# Patient Record
Sex: Female | Born: 1935 | Race: White | Hispanic: No | Smoking: Never smoker
Health system: Southern US, Community
[De-identification: ages and names within clinical notes are randomized; demographics above are authoritative.]

## PROBLEM LIST (undated history)

## (undated) DIAGNOSIS — R41841 Cognitive communication deficit: Secondary | ICD-10-CM

## (undated) DIAGNOSIS — E119 Type 2 diabetes mellitus without complications: Secondary | ICD-10-CM

## (undated) DIAGNOSIS — I639 Cerebral infarction, unspecified: Secondary | ICD-10-CM

## (undated) DIAGNOSIS — R296 Repeated falls: Secondary | ICD-10-CM

## (undated) DIAGNOSIS — R4182 Altered mental status, unspecified: Secondary | ICD-10-CM

## (undated) DIAGNOSIS — I4891 Unspecified atrial fibrillation: Secondary | ICD-10-CM

## (undated) DIAGNOSIS — I10 Essential (primary) hypertension: Secondary | ICD-10-CM

## (undated) DIAGNOSIS — F0390 Unspecified dementia without behavioral disturbance: Secondary | ICD-10-CM

## (undated) HISTORY — PX: OTHER SURGICAL HISTORY: SHX169

---

## 1998-03-11 ENCOUNTER — Emergency Department (HOSPITAL_COMMUNITY): Admission: EM | Admit: 1998-03-11 | Discharge: 1998-03-11 | Payer: Self-pay | Admitting: Emergency Medicine

## 1998-09-24 ENCOUNTER — Emergency Department (HOSPITAL_COMMUNITY): Admission: EM | Admit: 1998-09-24 | Discharge: 1998-09-24 | Payer: Self-pay | Admitting: Emergency Medicine

## 1998-10-22 ENCOUNTER — Encounter: Admission: RE | Admit: 1998-10-22 | Discharge: 1998-10-22 | Payer: Self-pay | Admitting: Family Medicine

## 1999-09-30 ENCOUNTER — Ambulatory Visit (HOSPITAL_COMMUNITY): Admission: RE | Admit: 1999-09-30 | Discharge: 1999-09-30 | Payer: Self-pay

## 1999-11-02 ENCOUNTER — Encounter: Admission: RE | Admit: 1999-11-02 | Discharge: 1999-11-02 | Payer: Self-pay | Admitting: Family Medicine

## 2000-06-22 ENCOUNTER — Encounter: Admission: RE | Admit: 2000-06-22 | Discharge: 2000-06-22 | Payer: Self-pay | Admitting: Sports Medicine

## 2000-07-19 ENCOUNTER — Encounter: Admission: RE | Admit: 2000-07-19 | Discharge: 2000-07-19 | Payer: Self-pay | Admitting: Obstetrics & Gynecology

## 2000-07-20 ENCOUNTER — Encounter: Admission: RE | Admit: 2000-07-20 | Discharge: 2000-07-20 | Payer: Self-pay | Admitting: Family Medicine

## 2000-08-25 ENCOUNTER — Encounter: Admission: RE | Admit: 2000-08-25 | Discharge: 2000-08-25 | Payer: Self-pay | Admitting: Internal Medicine

## 2000-08-31 ENCOUNTER — Encounter: Admission: RE | Admit: 2000-08-31 | Discharge: 2000-11-29 | Payer: Self-pay

## 2000-10-28 ENCOUNTER — Encounter: Admission: RE | Admit: 2000-10-28 | Discharge: 2000-10-28 | Payer: Self-pay

## 2000-11-10 ENCOUNTER — Encounter: Admission: RE | Admit: 2000-11-10 | Discharge: 2000-11-10 | Payer: Self-pay | Admitting: Internal Medicine

## 2000-12-31 ENCOUNTER — Encounter: Payer: Self-pay | Admitting: Emergency Medicine

## 2000-12-31 ENCOUNTER — Emergency Department (HOSPITAL_COMMUNITY): Admission: EM | Admit: 2000-12-31 | Discharge: 2000-12-31 | Payer: Self-pay | Admitting: Emergency Medicine

## 2001-04-05 ENCOUNTER — Encounter: Admission: RE | Admit: 2001-04-05 | Discharge: 2001-04-05 | Payer: Self-pay | Admitting: Internal Medicine

## 2001-04-11 ENCOUNTER — Encounter: Admission: RE | Admit: 2001-04-11 | Discharge: 2001-04-11 | Payer: Self-pay | Admitting: Internal Medicine

## 2001-07-03 ENCOUNTER — Encounter: Admission: RE | Admit: 2001-07-03 | Discharge: 2001-07-03 | Payer: Self-pay | Admitting: Internal Medicine

## 2001-07-26 ENCOUNTER — Encounter: Admission: RE | Admit: 2001-07-26 | Discharge: 2001-07-26 | Payer: Self-pay | Admitting: Family Medicine

## 2001-09-11 ENCOUNTER — Encounter: Admission: RE | Admit: 2001-09-11 | Discharge: 2001-09-11 | Payer: Self-pay | Admitting: Internal Medicine

## 2001-10-02 ENCOUNTER — Encounter: Admission: RE | Admit: 2001-10-02 | Discharge: 2001-10-02 | Payer: Self-pay

## 2001-12-29 ENCOUNTER — Encounter: Admission: RE | Admit: 2001-12-29 | Discharge: 2001-12-29 | Payer: Self-pay | Admitting: Internal Medicine

## 2002-04-06 ENCOUNTER — Encounter: Admission: RE | Admit: 2002-04-06 | Discharge: 2002-04-06 | Payer: Self-pay | Admitting: Internal Medicine

## 2002-06-12 ENCOUNTER — Encounter: Admission: RE | Admit: 2002-06-12 | Discharge: 2002-06-12 | Payer: Self-pay | Admitting: Internal Medicine

## 2002-06-15 ENCOUNTER — Encounter: Admission: RE | Admit: 2002-06-15 | Discharge: 2002-06-15 | Payer: Self-pay | Admitting: Internal Medicine

## 2002-07-17 ENCOUNTER — Encounter: Admission: RE | Admit: 2002-07-17 | Discharge: 2002-07-17 | Payer: Self-pay | Admitting: Internal Medicine

## 2002-07-20 ENCOUNTER — Encounter: Admission: RE | Admit: 2002-07-20 | Discharge: 2002-07-20 | Payer: Self-pay | Admitting: Internal Medicine

## 2002-09-06 ENCOUNTER — Encounter: Admission: RE | Admit: 2002-09-06 | Discharge: 2002-09-06 | Payer: Self-pay | Admitting: Internal Medicine

## 2002-09-18 ENCOUNTER — Encounter: Admission: RE | Admit: 2002-09-18 | Discharge: 2002-09-18 | Payer: Self-pay | Admitting: Internal Medicine

## 2003-01-03 ENCOUNTER — Encounter: Admission: RE | Admit: 2003-01-03 | Discharge: 2003-01-03 | Payer: Self-pay | Admitting: Internal Medicine

## 2003-03-20 ENCOUNTER — Encounter (INDEPENDENT_AMBULATORY_CARE_PROVIDER_SITE_OTHER): Payer: Self-pay | Admitting: *Deleted

## 2003-04-01 ENCOUNTER — Encounter: Admission: RE | Admit: 2003-04-01 | Discharge: 2003-04-01 | Payer: Self-pay | Admitting: Internal Medicine

## 2003-04-03 ENCOUNTER — Emergency Department (HOSPITAL_COMMUNITY): Admission: EM | Admit: 2003-04-03 | Discharge: 2003-04-03 | Payer: Self-pay | Admitting: Emergency Medicine

## 2003-04-03 ENCOUNTER — Encounter: Payer: Self-pay | Admitting: Emergency Medicine

## 2003-05-30 ENCOUNTER — Encounter: Admission: RE | Admit: 2003-05-30 | Discharge: 2003-05-30 | Payer: Self-pay | Admitting: Internal Medicine

## 2003-06-04 ENCOUNTER — Encounter: Admission: RE | Admit: 2003-06-04 | Discharge: 2003-06-04 | Payer: Self-pay | Admitting: Internal Medicine

## 2003-07-01 ENCOUNTER — Encounter: Admission: RE | Admit: 2003-07-01 | Discharge: 2003-07-01 | Payer: Self-pay | Admitting: Internal Medicine

## 2003-08-05 ENCOUNTER — Encounter: Admission: RE | Admit: 2003-08-05 | Discharge: 2003-08-05 | Payer: Self-pay | Admitting: Internal Medicine

## 2003-09-09 ENCOUNTER — Encounter: Admission: RE | Admit: 2003-09-09 | Discharge: 2003-12-08 | Payer: Self-pay | Admitting: Internal Medicine

## 2003-10-23 ENCOUNTER — Encounter: Admission: RE | Admit: 2003-10-23 | Discharge: 2003-10-23 | Payer: Self-pay | Admitting: Internal Medicine

## 2004-01-10 ENCOUNTER — Encounter: Admission: RE | Admit: 2004-01-10 | Discharge: 2004-01-10 | Payer: Self-pay | Admitting: Internal Medicine

## 2004-02-07 ENCOUNTER — Encounter: Admission: RE | Admit: 2004-02-07 | Discharge: 2004-02-07 | Payer: Self-pay | Admitting: Internal Medicine

## 2004-02-14 ENCOUNTER — Encounter: Admission: RE | Admit: 2004-02-14 | Discharge: 2004-02-14 | Payer: Self-pay | Admitting: Internal Medicine

## 2004-02-24 ENCOUNTER — Encounter: Admission: RE | Admit: 2004-02-24 | Discharge: 2004-02-24 | Payer: Self-pay | Admitting: Internal Medicine

## 2004-03-02 ENCOUNTER — Encounter: Admission: RE | Admit: 2004-03-02 | Discharge: 2004-03-02 | Payer: Self-pay | Admitting: Internal Medicine

## 2004-04-02 ENCOUNTER — Encounter: Admission: RE | Admit: 2004-04-02 | Discharge: 2004-07-01 | Payer: Self-pay | Admitting: Internal Medicine

## 2004-04-22 ENCOUNTER — Encounter: Admission: RE | Admit: 2004-04-22 | Discharge: 2004-04-22 | Payer: Self-pay | Admitting: Internal Medicine

## 2004-10-08 ENCOUNTER — Ambulatory Visit: Payer: Self-pay | Admitting: Internal Medicine

## 2004-12-25 ENCOUNTER — Ambulatory Visit: Payer: Self-pay | Admitting: Internal Medicine

## 2005-01-11 ENCOUNTER — Ambulatory Visit: Payer: Self-pay | Admitting: Internal Medicine

## 2005-01-19 ENCOUNTER — Ambulatory Visit: Payer: Self-pay | Admitting: Internal Medicine

## 2005-07-16 ENCOUNTER — Ambulatory Visit: Payer: Self-pay | Admitting: Internal Medicine

## 2005-07-23 ENCOUNTER — Ambulatory Visit: Payer: Self-pay | Admitting: Internal Medicine

## 2005-08-02 ENCOUNTER — Ambulatory Visit: Payer: Self-pay | Admitting: Internal Medicine

## 2005-08-17 ENCOUNTER — Ambulatory Visit: Payer: Self-pay | Admitting: Internal Medicine

## 2005-11-11 ENCOUNTER — Ambulatory Visit: Payer: Self-pay | Admitting: Internal Medicine

## 2005-11-11 ENCOUNTER — Encounter (INDEPENDENT_AMBULATORY_CARE_PROVIDER_SITE_OTHER): Payer: Self-pay | Admitting: *Deleted

## 2005-11-19 ENCOUNTER — Ambulatory Visit: Payer: Self-pay | Admitting: Internal Medicine

## 2006-01-04 ENCOUNTER — Ambulatory Visit: Payer: Self-pay | Admitting: Internal Medicine

## 2006-02-07 ENCOUNTER — Ambulatory Visit: Payer: Self-pay | Admitting: Internal Medicine

## 2006-07-11 DIAGNOSIS — M899 Disorder of bone, unspecified: Secondary | ICD-10-CM | POA: Insufficient documentation

## 2006-07-11 DIAGNOSIS — N952 Postmenopausal atrophic vaginitis: Secondary | ICD-10-CM

## 2006-07-11 DIAGNOSIS — E119 Type 2 diabetes mellitus without complications: Secondary | ICD-10-CM

## 2006-07-11 DIAGNOSIS — I8390 Asymptomatic varicose veins of unspecified lower extremity: Secondary | ICD-10-CM

## 2006-07-11 DIAGNOSIS — E785 Hyperlipidemia, unspecified: Secondary | ICD-10-CM

## 2006-07-11 DIAGNOSIS — M949 Disorder of cartilage, unspecified: Secondary | ICD-10-CM

## 2006-07-11 DIAGNOSIS — Z91199 Patient's noncompliance with other medical treatment and regimen due to unspecified reason: Secondary | ICD-10-CM | POA: Insufficient documentation

## 2006-07-11 DIAGNOSIS — Z9119 Patient's noncompliance with other medical treatment and regimen: Secondary | ICD-10-CM

## 2006-07-11 DIAGNOSIS — I10 Essential (primary) hypertension: Secondary | ICD-10-CM | POA: Insufficient documentation

## 2006-12-19 ENCOUNTER — Telehealth: Payer: Self-pay | Admitting: *Deleted

## 2008-05-02 ENCOUNTER — Emergency Department (HOSPITAL_COMMUNITY): Admission: EM | Admit: 2008-05-02 | Discharge: 2008-05-02 | Payer: Self-pay | Admitting: Emergency Medicine

## 2009-05-31 ENCOUNTER — Emergency Department (HOSPITAL_COMMUNITY): Admission: EM | Admit: 2009-05-31 | Discharge: 2009-05-31 | Payer: Self-pay | Admitting: Emergency Medicine

## 2010-04-26 ENCOUNTER — Emergency Department (HOSPITAL_COMMUNITY): Admission: EM | Admit: 2010-04-26 | Discharge: 2010-04-26 | Payer: Self-pay | Admitting: Emergency Medicine

## 2011-02-02 NOTE — Consult Note (Signed)
NAME:  [name], [name] NO.:  [id number]   MEDICAL RECORD NO.:  [id number]          PATIENT TYPE:  EMS   LOCATION:  [facility]                         FACILITY:  [facility]   PHYSICIAN:  Alvy Beal, MD    DATE OF BIRTH:  09-Dec-1920   DATE OF CONSULTATION:  05/02/2008  DATE OF DISCHARGE:  05/02/2008                                 CONSULTATION   Consultation requested by the emergency room physician, Dr. Bebe Shaggy,  for evaluation of left patella fracture.   HISTORY:  Megan Arnold is a very pleasant, very active young 77 year old woman  who was out at the pool swimming today.  She had lost her balance and  tripped over an object and fell and struck her left knee and left wrist.  She was brought to the emergency room because of pain with attempts at  ambulation.  X-rays here in the emergency room demonstrated a minimally  displaced left patella fracture as well as a triquetral fracture of the  left wrist.  The hand consultation was requested by the on-call surgeon,  and an orthopedic consultation was requested for evaluation of the  patellar fracture.   The patient's past medical, surgical, family, and social history  includes recent onset of diabetes treated medically with metformin.  She  also has hypertension.  She is currently taking hydrochlorothiazide.  She is a nondrinker, nonsmoker, and is otherwise healthy.   She has no known drug allergies.   CLINICAL EXAM:  She is a pleasant woman who appears her stated age in no  acute distress.  She is alert and oriented x3.  She is neurovascularly  intact with 2+ dorsalis pedis and posterior tibialis pulses.  The calf  is soft and nontender.  EHL, gastrocnemius, and tibialis anterior are  all intact 5/5 bilaterally.  She has a small superficial abrasion over  the left knee, but there is no evidence of any violation of the dermis.  Her left upper extremity is in a wrist splint.  She is moving all  fingers.  She is actually  weightbearing with the crutches without  significant wrist pain.  There is no elbow or shoulder pain.  No  shortness of breath or chest pain.  Abdomen is soft and nontender.   X-rays demonstrate a minimally displaced patellar fracture.  There is no  patella alta, patella baja, or significant diastasis.   At this point in time, the patient's clinical exam demonstrates an  effusion and tenderness, but overall the extensor mechanism is grossly  intact.  She is able to straight leg raise.  It is very painful, but she  is able to do that.  There is no palpable gap, but there is tenderness  at the patella.  At this point, given the fact of her gaze, relative  osteoporosis, I do not think surgical fixation (open reduction and  internal fixation) is required.  I am going to see her again in a week  to re-evaluate.  In the meantime, I offered her narcotic pain  medications.  She said she just wants to use Tylenol which I think is  adequate.  I am going to have her followup with me in about a week so I  can repeat the x-rays.  We will keep her in a knee immobilizer at all  times to prevent any flexion.  At this point, because of the wrist  fracture, I am a little bit concerned about using a crutch and  weightbearing on the triquetral fracture and so I recommended a platform  walker.  If there are any questions or concerns, she and her daughter  have my contact information.  I have asked them to call tomorrow to make  an appointment for next week.       Alvy Beal, MD  Electronically Signed     DDB/MEDQ  D:  05/02/2008  T:  05/03/2008  Job:  [id number]

## 2016-04-13 ENCOUNTER — Emergency Department
Admission: EM | Admit: 2016-04-13 | Discharge: 2016-04-13 | Disposition: A | Discharge status: Home / Self Care | Payer: Medicare Other | Attending: Emergency Medicine | Admitting: Emergency Medicine

## 2016-04-13 ENCOUNTER — Encounter: Payer: Self-pay | Admitting: Emergency Medicine

## 2016-04-13 DIAGNOSIS — Y939 Activity, unspecified: Secondary | ICD-10-CM | POA: Diagnosis not present

## 2016-04-13 DIAGNOSIS — Z23 Encounter for immunization: Secondary | ICD-10-CM | POA: Insufficient documentation

## 2016-04-13 DIAGNOSIS — Y929 Unspecified place or not applicable: Secondary | ICD-10-CM | POA: Insufficient documentation

## 2016-04-13 DIAGNOSIS — S81811A Laceration without foreign body, right lower leg, initial encounter: Secondary | ICD-10-CM | POA: Diagnosis not present

## 2016-04-13 DIAGNOSIS — Y999 Unspecified external cause status: Secondary | ICD-10-CM | POA: Diagnosis not present

## 2016-04-13 DIAGNOSIS — I10 Essential (primary) hypertension: Secondary | ICD-10-CM | POA: Insufficient documentation

## 2016-04-13 DIAGNOSIS — E119 Type 2 diabetes mellitus without complications: Secondary | ICD-10-CM | POA: Diagnosis not present

## 2016-04-13 DIAGNOSIS — X58XXXA Exposure to other specified factors, initial encounter: Secondary | ICD-10-CM | POA: Insufficient documentation

## 2016-04-13 DIAGNOSIS — E785 Hyperlipidemia, unspecified: Secondary | ICD-10-CM | POA: Diagnosis not present

## 2016-04-13 HISTORY — DX: Essential (primary) hypertension: I10

## 2016-04-13 MED ORDER — CLINDAMYCIN HCL 150 MG PO CAPS: ORAL_CAPSULE | ORAL | 0 refills | Status: DC

## 2016-04-13 MED ORDER — TETANUS-DIPHTH-ACELL PERTUSSIS 5-2.5-18.5 LF-MCG/0.5 IM SUSP
0.5000 mL | Freq: Once | INTRAMUSCULAR | Status: AC
  Administered 2016-04-13: 0.5 mL via INTRAMUSCULAR
  Filled 2016-04-13: qty 0.5

## 2016-04-13 NOTE — ED Provider Notes (Signed)
[facility] Emergency Department Provider Note   ____________________________________________  Time seen: Approximately 1:14 PM  I have reviewed the triage vital signs and the nursing notes.   HISTORY  Chief Complaint Leg Injury    HPI Megan Arnold is a 82 y.o. female is here with complaint of a skin tear to the back of her right lower leg. This happened a couple days ago and this morning she was putting a new dressing on her leg when he began to bleed. Patient became concerned because she is diabetic and was worried about infection. She is also uncertain of when last time she had a tetanus booster. She states that she scraped her leg on a box that is being built at her apartment. She denies any fall or head injury with this incident. She states she does live alone in her apartment however there are family members that live in the same apartment building with her. Looking at her vital signs patient states that she does not take her blood pressure medicine on a regular basis and also does not see her doctor regularly as well. She denies any headache or difficulty with her vision. Currently she is upset because of the wait time in the emergency room today.   Past Medical History:  Diagnosis Date  . Hypertension     Patient Active Problem List   Diagnosis Date Noted  . DIABETES MELLITUS, TYPE II 07/11/2006  . HYPERLIPIDEMIA 07/11/2006  . HYPERTENSION 07/11/2006  . VARICOSE VEINS, LOWER EXTREMITIES 07/11/2006  . VAGINITIS, ATROPHIC 07/11/2006  . OSTEOPENIA 07/11/2006  . HX, PERSONAL, PAST NONCOMPLIANCE 07/11/2006    History reviewed. No pertinent surgical history.    Allergies Penicillins  No family history on file.  Social History Social History  Substance Use Topics  . Smoking status: Never Smoker  . Smokeless tobacco: Never Used  . Alcohol use No    Review of Systems Constitutional: No fever/chills Eyes: No visual changes. ENT:  No sore throat. Cardiovascular: Denies chest pain. Respiratory: Denies shortness of breath. Gastrointestinal: No abdominal pain.  No nausea, no vomiting.   Genitourinary: Negative for dysuria. Musculoskeletal: Negative for back pain. Skin: Positive for laceration. Neurological: Negative for headaches, focal weakness or numbness.  10-point ROS otherwise negative.  ____________________________________________   PHYSICAL EXAM:  VITAL SIGNS: ED Triage Vitals [04/13/16 1157]  Enc Vitals Group     BP (!) 158/106     Pulse Rate (!) 105     Resp 20     Temp 98.2 F (36.8 C)     Temp Source Oral     SpO2 98 %     Weight 130 lb (59 kg)     Height  (1.626 m)     Head Circumference      Peak Flow      Pain Score      Pain Loc      Pain Edu?      Excl. in GC?     Constitutional: Alert and oriented. Well appearing and in no acute distress.Patient is very loud in the exam room and disgruntled over the wait time. Eyes: Conjunctivae are normal. PERRL. EOMI. Head: Atraumatic. Nose: No congestion/rhinnorhea. Neck: No stridor.   Cardiovascular: Normal rate, regular rhythm. Grossly normal heart sounds.  Good peripheral circulation. Respiratory: Normal respiratory effort.  No retractions. Lungs CTAB. Musculoskeletal: No lower extremity tenderness nor edema.  No joint effusions.Patient moves upper and lower extremities without any difficulty. Neurologic:  Normal speech  and language. No gross focal neurologic deficits are appreciated.  Skin:  There is multiple ecchymotic areas in very stages of healing on the lower extremities as well as the upper extremities. There is a healing skin tear on the left lower posterior leg without any evidence of infection. The right leg posteriorly there are 2 individual 2 mm lacerations that are superficial with no active bleeding and appears to be healing without any signs of infection. There is nontender to palpation and no foreign body was  seen. Psychiatric: Mood and affect are normal. Speech and behavior are normal.  ____________________________________________   LABS (all labs ordered are listed, but only abnormal results are displayed)  Labs Reviewed - No data to display   PROCEDURES  Procedure(s) performed: None  Procedures  Critical Care performed: No  ____________________________________________   INITIAL IMPRESSION / ASSESSMENT AND PLAN / ED COURSE  Pertinent labs & imaging results that were available during my care of the patient were reviewed by me and considered in my medical decision making (see chart for details).    Clinical Course  Area was cleaned and restore was applied to the right leg. Patient was told to leave this special dressing on for 7 days. Patient was quite anxious about being diabetic and having a skin infection. Patient was placed on clindamycin 150 mg one 4 times a day for 5 days. Patient was encouraged to be compliant with her medications and follow-up with her primary care doctor. The pressure was rechecked prior to her discharge and patient admitted that most likely she has not taken her blood pressure medicine some time.   ____________________________________________   FINAL CLINICAL IMPRESSION(S) / ED DIAGNOSES  Final diagnoses:  Noninfected skin tear of right leg, initial encounter      NEW MEDICATIONS STARTED DURING THIS VISIT:  There are no discharge medications for this patient.    Note:  This document was prepared using Dragon voice recognition software and may include unintentional dictation errors.    Tommi Rumps, PA-C 04/13/16 1634    Nita Sickle, MD 04/13/16 2212

## 2016-04-13 NOTE — Discharge Instructions (Signed)
Leave the dressing that you have on presently for the next 7 days. This is a special dressing called "restore" which is made specifically for skin tears. You have also been placed on an antibiotic to prevent skin infection. Follow-up with your primary care doctor if any continued problems. You were also given a tetanus booster.

## 2016-04-13 NOTE — ED Triage Notes (Signed)
Pt presents with left lower leg injury and skin tear.

## 2017-02-09 ENCOUNTER — Emergency Department
Admission: EM | Admit: 2017-02-09 | Discharge: 2017-02-09 | Disposition: A | Discharge status: Home / Self Care | Payer: Medicare Other | Attending: Emergency Medicine | Admitting: Emergency Medicine

## 2017-02-09 DIAGNOSIS — Z79899 Other long term (current) drug therapy: Secondary | ICD-10-CM | POA: Diagnosis not present

## 2017-02-09 DIAGNOSIS — I10 Essential (primary) hypertension: Secondary | ICD-10-CM | POA: Diagnosis not present

## 2017-02-09 DIAGNOSIS — I480 Paroxysmal atrial fibrillation: Secondary | ICD-10-CM | POA: Diagnosis not present

## 2017-02-09 DIAGNOSIS — Z7984 Long term (current) use of oral hypoglycemic drugs: Secondary | ICD-10-CM | POA: Diagnosis not present

## 2017-02-09 DIAGNOSIS — E119 Type 2 diabetes mellitus without complications: Secondary | ICD-10-CM | POA: Diagnosis not present

## 2017-02-09 DIAGNOSIS — I4891 Unspecified atrial fibrillation: Secondary | ICD-10-CM

## 2017-02-09 LAB — CBC WITH DIFFERENTIAL/PLATELET
Basophils Absolute: 0.1 10*3/uL (ref 0–0.1)
Basophils Relative: 1 %
EOS ABS: 0.1 10*3/uL (ref 0–0.7)
Eosinophils Relative: 1 %
HEMATOCRIT: 39.4 % (ref 35.0–47.0)
HEMOGLOBIN: 13.4 g/dL (ref 12.0–16.0)
LYMPHS ABS: 1.6 10*3/uL (ref 1.0–3.6)
Lymphocytes Relative: 16 %
MCH: 28.2 pg (ref 26.0–34.0)
MCHC: 34.1 g/dL (ref 32.0–36.0)
MCV: 82.8 fL (ref 80.0–100.0)
Monocytes Absolute: 0.8 10*3/uL (ref 0.2–0.9)
Monocytes Relative: 9 %
NEUTROS ABS: 6.9 10*3/uL — AB (ref 1.4–6.5)
Neutrophils Relative %: 73 %
Platelets: 232 10*3/uL (ref 150–440)
RBC: 4.75 MIL/uL (ref 3.80–5.20)
RDW: 13.9 % (ref 11.5–14.5)
WBC: 9.5 10*3/uL (ref 3.6–11.0)

## 2017-02-09 LAB — COMPREHENSIVE METABOLIC PANEL
ALBUMIN: 4.4 g/dL (ref 3.5–5.0)
ALK PHOS: 56 U/L (ref 38–126)
ALT: 12 U/L — AB (ref 14–54)
AST: 24 U/L (ref 15–41)
Anion gap: 7 (ref 5–15)
BUN: 21 mg/dL — ABNORMAL HIGH (ref 6–20)
CO2: 25 mmol/L (ref 22–32)
CREATININE: 1.05 mg/dL — AB (ref 0.44–1.00)
Calcium: 9.6 mg/dL (ref 8.9–10.3)
Chloride: 108 mmol/L (ref 101–111)
GFR calc Af Amer: 51 mL/min — ABNORMAL LOW (ref >60)
GFR calc non Af Amer: 44 mL/min — ABNORMAL LOW (ref >60)
GLUCOSE: 149 mg/dL — AB (ref 65–99)
Potassium: 4.3 mmol/L (ref 3.5–5.1)
SODIUM: 140 mmol/L (ref 135–145)
Total Bilirubin: 0.8 mg/dL (ref 0.3–1.2)
Total Protein: 8.1 g/dL (ref 6.5–8.1)

## 2017-02-09 LAB — MAGNESIUM: Magnesium: 2.1 mg/dL (ref 1.7–2.4)

## 2017-02-09 LAB — TROPONIN I: Troponin I: <0.03 ng/mL (ref <0.03)

## 2017-02-09 NOTE — Discharge Instructions (Signed)
As we discussed, because you have no discomfort or symptoms including chest pain or shortness of breath, you have a documented history of prior atrial fibrillation, with no intervention your heart rate has settled down into the 80s and 90s, and your lab work was all reassuring today, we all agreed that you are safe to go home and follow-up as an outpatient.  No additional intervention is recommended at this time.  Please take all of your regular medications as prescribed.  Return to the emergency department if you develop new or worsening symptoms that concern you.

## 2017-02-09 NOTE — ED Notes (Signed)
Pt verbalized understanding of discharge instructions. NAD at this time. 

## 2017-02-09 NOTE — ED Triage Notes (Signed)
Pt to ED by EMS from [facility]. Per EMS pt sent to ED due to Afib rhythm on EKG. Pt has hx of Afib. Pt denies pain or any other symptoms. Pt states she did not want to come to the ED.

## 2017-02-09 NOTE — ED Provider Notes (Signed)
[facility] Emergency Department Provider Note  ____________________________________________   First MD Initiated Contact with Patient 02/09/17 1640     (approximate)  I have reviewed the triage vital signs and the nursing notes.   HISTORY  Chief Complaint Atrial Fibrillation  Level 5 caveat:  history limited by chronic dementia  HPI Megan Arnold is a 83 y.o. female with a history that includes hypertension, history of atrial fibrillation listed on her EMR, and dementia who presents by EMS for evaluation of atrial fibrillation with rapid ventricular response. Reportedly she went to a new primary care doctor today who sent her to the emergency department for further evaluation. She was extremely displeased with this. She denies having any symptoms specifically including chest pain, shortness of breath, palpitations, nausea, vomiting, abdominal pain, and dysuria. She stated that her family confirmed ( and I also confirmed in a prior note in care everywhere) that she has been diagnosed previously with atrial fibrillation. She felt that the trip to the emergency department was unnecessary today. She cannot quantify her symptoms as mild, moderate, nor severe because she denies any symptoms.  She is present today with her daughter and niece. They both confirmed that the patient has some dementia and gradually over time has been increasingly difficult to deal with due to anxiety, tangential thoughts, etc., but also all agree that she is not in any acute distress and has not been complaining of any medical issues.  Upon her arrival to the emergency department she has an irregularly irregular heartbeat with a rate and the one teens to 130s. As she gets , telling me the story of her clinic visit today, her heart rate will increase but then when she calms down a drops back down to a lower, more manageable rate.  Past Medical History:  Diagnosis Date  . Hypertension      Patient Active Problem List   Diagnosis Date Noted  . DIABETES MELLITUS, TYPE II 07/11/2006  . HYPERLIPIDEMIA 07/11/2006  . HYPERTENSION 07/11/2006  . VARICOSE VEINS, LOWER EXTREMITIES 07/11/2006  . VAGINITIS, ATROPHIC 07/11/2006  . OSTEOPENIA 07/11/2006  . HX, PERSONAL, PAST NONCOMPLIANCE 07/11/2006    No past surgical history on file.  Prior to Admission medications   Medication Sig Start Date End Date Taking? Authorizing Provider  donepezil (ARICEPT) 23 MG TABS tablet Take 23 mg by mouth daily. 02/09/17  Yes [provider]  glimepiride (AMARYL) 4 MG tablet Take 4 mg by mouth daily. 01/29/17  Yes [provider]  JANUVIA 100 MG tablet  01/29/17  Yes [provider]  lisinopril (PRINIVIL,ZESTRIL) 20 MG tablet Take 20 mg by mouth daily. 01/29/17  Yes [provider]  metoprolol succinate (TOPROL-XL) 25 MG 24 hr tablet Take 25 mg by mouth daily. 02/09/17  Yes [provider]  NAMENDA XR 28 MG CP24 24 hr capsule Take 28 mg by mouth daily. 02/09/17  Yes [provider]  pravastatin (PRAVACHOL) 40 MG tablet Take 40 mg by mouth daily. 02/09/17  Yes [provider]    Allergies Penicillins  No family history on file.  Social History Social History  Substance Use Topics  . Smoking status: Never Smoker  . Smokeless tobacco: Never Used  . Alcohol use No    Review of Systems level V caveat: the patient's history is limited by dementia, but she denies any symptoms today as described in the HPI and specifically denies chest pain, shortness of breath, and palpitations ____________________________________________   PHYSICAL  EXAM:  VITAL SIGNS: ED Triage Vitals  Enc Vitals Group     BP 02/09/17 1629 (!) 154/95     Pulse Rate 02/09/17 1629 (!) 114     Resp 02/09/17 1629 19     Temp 02/09/17 1629 98.9 F (37.2 C)     Temp Source 02/09/17 1629 Oral     SpO2 02/09/17 1629 100 %     Weight 02/09/17 1622 54.9 kg (121 lb)      Height 02/09/17 1622 1.575 m (5\' 2" )     Head Circumference --      Peak Flow --      Pain Score --      Pain Loc --      Pain Edu? --      Excl. in GC? --     Constitutional: Alert and oriented. Well appearing for her age. The patient is extremely talkative, has a high level vocabulary, and is extremely expressive of her opinions Eyes: Conjunctivae are normal.  Head: Atraumatic. Nose: No congestion/rhinnorhea. Mouth/Throat: Mucous membranes are moist. Neck: No stridor.  No meningeal signs.   Cardiovascular: tachycardia typically between 110-135 with irregular rhythm. Good peripheral circulation. Grossly normal heart sounds. Respiratory: Normal respiratory effort.  No retractions. Lungs CTAB. Gastrointestinal: Soft and nontender. No distention.  Musculoskeletal: No lower extremity tenderness nor edema. No gross deformities of extremities. Neurologic:  Normal speech and language. No gross focal neurologic deficits are appreciated.  Skin:  Skin is warm, dry and intact. No rash noted.  ____________________________________________   LABS (all labs ordered are listed, but only abnormal results are displayed)  Labs Reviewed  COMPREHENSIVE METABOLIC PANEL - Abnormal; Notable for the following:       Result Value   Glucose, Bld 149 (*)    BUN 21 (*)    Creatinine, Ser 1.05 (*)    ALT 12 (*)    GFR calc non Af Amer 44 (*)    GFR calc Af Amer 51 (*)    All other components within normal limits  CBC WITH DIFFERENTIAL/PLATELET - Abnormal; Notable for the following:    Neutro Abs 6.9 (*)    All other components within normal limits  MAGNESIUM  TROPONIN I   ____________________________________________  EKG  ED ECG REPORT I, Emerson Schreifels, the attending physician, personally viewed and interpreted this ECG.  Date: 02/09/2017 EKG Time: 16:24 Rate: 115 Rhythm: a-fib with RVR QRS Axis: normal Intervals: abnormal due to a-fib ST/T Wave abnormalities: Non-specific ST segment  / T-wave changes, but no evidence of acute ischemia. Conduction Disturbances: none Narrative Interpretation: unremarkable  ____________________________________________  RADIOLOGY   No results found.  ____________________________________________   PROCEDURES  Critical Care performed: No   Procedure(s) performed:   Procedures   ____________________________________________   INITIAL IMPRESSION / ASSESSMENT AND PLAN / ED COURSE  Pertinent labs & imaging results that were available during my care of the patient were reviewed by me and considered in my medical decision making (see chart for details).  the patient has a big personality and is extremely talkative emphatically states that she does not want to be here. Her family confirms that they are not concerned about her medically. Initially the patient refused all lab work but I encouraged her to do so if we promise to get her some food first. I will look for any acute lab abnormalities including electrolyte disturbances or an elevated troponin. If these are normal and the patient continues to feel well, I will discharge for outpatient  follow-up. In spite of her advanced age and dementia, she is alert and appropriate and  in my opinion has the capacity to agree to or refuse treatment today. Her family agrees with this assessment.  Clinical Course as of Feb 09 2350  Wed Feb 09, 2017  1610 The patient's heart rate is down into the 80s.  Blood work is all reassuring.  The patient has no complaints.  There is no indication to keep the patient in the hospital especially with a documented history of atrial fibrillation.  I discussed this with her daughter and her niece and they are comfortable with discharge and outpatient follow-up.  I gave my usual and customary return precautions.     [CF]    Clinical Course User Index [CF] Loleta Rose, MD    ____________________________________________  FINAL CLINICAL IMPRESSION(S) / ED  DIAGNOSES  Final diagnoses:  Atrial fibrillation with RVR (HCC)     MEDICATIONS GIVEN DURING THIS VISIT:  Medications - No data to display   NEW OUTPATIENT MEDICATIONS STARTED DURING THIS VISIT:  Discharge Medication List as of 02/09/2017  6:24 PM      Discharge Medication List as of 02/09/2017  6:24 PM      Discharge Medication List as of 02/09/2017  6:24 PM       Note:  This document was prepared using Dragon voice recognition software and may include unintentional dictation errors.    Loleta Rose, MD 02/09/17 2351

## 2017-08-18 ENCOUNTER — Telehealth: Payer: Self-pay | Admitting: Podiatry

## 2017-08-18 NOTE — Telephone Encounter (Signed)
Received a referral from [facility] for patient stating she needs nail trim and diabetic foot care. Instructions said to contact her daughter at [phone] to schedule all appointments. I called the daughter she said that I would have to contact the patient to schedule so she can arrange transportation. Called patient and she stated she was not interested in making  This appointment. She did not need the foot care.

## 2017-09-17 ENCOUNTER — Emergency Department: Payer: Medicare Other

## 2017-09-17 ENCOUNTER — Encounter: Payer: Self-pay | Admitting: Emergency Medicine

## 2017-09-17 ENCOUNTER — Inpatient Hospital Stay
Admission: EM | Admit: 2017-09-17 | Discharge: 2017-09-20 | DRG: 065 | Disposition: A | Discharge status: Skilled Nursing Facility | Payer: Medicare Other | Attending: Internal Medicine | Admitting: Internal Medicine

## 2017-09-17 ENCOUNTER — Other Ambulatory Visit: Payer: Self-pay

## 2017-09-17 DIAGNOSIS — Z7982 Long term (current) use of aspirin: Secondary | ICD-10-CM

## 2017-09-17 DIAGNOSIS — Z79899 Other long term (current) drug therapy: Secondary | ICD-10-CM

## 2017-09-17 DIAGNOSIS — I10 Essential (primary) hypertension: Secondary | ICD-10-CM

## 2017-09-17 DIAGNOSIS — E876 Hypokalemia: Secondary | ICD-10-CM | POA: Diagnosis present

## 2017-09-17 DIAGNOSIS — Z7984 Long term (current) use of oral hypoglycemic drugs: Secondary | ICD-10-CM | POA: Diagnosis not present

## 2017-09-17 DIAGNOSIS — K219 Gastro-esophageal reflux disease without esophagitis: Secondary | ICD-10-CM | POA: Diagnosis present

## 2017-09-17 DIAGNOSIS — E119 Type 2 diabetes mellitus without complications: Secondary | ICD-10-CM | POA: Diagnosis present

## 2017-09-17 DIAGNOSIS — R29702 NIHSS score 2: Secondary | ICD-10-CM | POA: Diagnosis present

## 2017-09-17 DIAGNOSIS — F0281 Dementia in other diseases classified elsewhere with behavioral disturbance: Secondary | ICD-10-CM | POA: Diagnosis present

## 2017-09-17 DIAGNOSIS — G309 Alzheimer's disease, unspecified: Secondary | ICD-10-CM | POA: Diagnosis present

## 2017-09-17 DIAGNOSIS — I63532 Cerebral infarction due to unspecified occlusion or stenosis of left posterior cerebral artery: Secondary | ICD-10-CM | POA: Diagnosis not present

## 2017-09-17 DIAGNOSIS — F0150 Vascular dementia without behavioral disturbance: Secondary | ICD-10-CM

## 2017-09-17 DIAGNOSIS — I4891 Unspecified atrial fibrillation: Secondary | ICD-10-CM | POA: Diagnosis present

## 2017-09-17 DIAGNOSIS — F0151 Vascular dementia with behavioral disturbance: Secondary | ICD-10-CM | POA: Diagnosis present

## 2017-09-17 DIAGNOSIS — N3000 Acute cystitis without hematuria: Secondary | ICD-10-CM | POA: Diagnosis present

## 2017-09-17 DIAGNOSIS — R4182 Altered mental status, unspecified: Secondary | ICD-10-CM

## 2017-09-17 DIAGNOSIS — F0280 Dementia in other diseases classified elsewhere without behavioral disturbance: Secondary | ICD-10-CM

## 2017-09-17 DIAGNOSIS — I639 Cerebral infarction, unspecified: Secondary | ICD-10-CM | POA: Diagnosis present

## 2017-09-17 DIAGNOSIS — E785 Hyperlipidemia, unspecified: Secondary | ICD-10-CM | POA: Diagnosis present

## 2017-09-17 LAB — COMPREHENSIVE METABOLIC PANEL
ALK PHOS: 65 U/L (ref 38–126)
ALT: 17 U/L (ref 14–54)
ANION GAP: 9 (ref 5–15)
AST: 28 U/L (ref 15–41)
Albumin: 4.5 g/dL (ref 3.5–5.0)
BILIRUBIN TOTAL: 1.1 mg/dL (ref 0.3–1.2)
BUN: 21 mg/dL — ABNORMAL HIGH (ref 6–20)
CALCIUM: 9.2 mg/dL (ref 8.9–10.3)
CO2: 25 mmol/L (ref 22–32)
CREATININE: 1.09 mg/dL — AB (ref 0.44–1.00)
Chloride: 101 mmol/L (ref 101–111)
GFR calc non Af Amer: 42 mL/min — ABNORMAL LOW (ref >60)
GFR, EST AFRICAN AMERICAN: 49 mL/min — AB (ref >60)
Glucose, Bld: 190 mg/dL — ABNORMAL HIGH (ref 65–99)
Potassium: 3.2 mmol/L — ABNORMAL LOW (ref 3.5–5.1)
Sodium: 135 mmol/L (ref 135–145)
TOTAL PROTEIN: 7.9 g/dL (ref 6.5–8.1)

## 2017-09-17 LAB — URINALYSIS, COMPLETE (UACMP) WITH MICROSCOPIC
BILIRUBIN URINE: NEGATIVE
GLUCOSE, UA: NEGATIVE mg/dL
HGB URINE DIPSTICK: NEGATIVE
Ketones, ur: 5 mg/dL — AB
NITRITE: NEGATIVE
PH: 5 (ref 5.0–8.0)
Protein, ur: 30 mg/dL — AB
SPECIFIC GRAVITY, URINE: 1.023 (ref 1.005–1.030)

## 2017-09-17 LAB — CBC WITH DIFFERENTIAL/PLATELET
BASOS PCT: 2 %
Basophils Absolute: 0.2 10*3/uL — ABNORMAL HIGH (ref 0–0.1)
EOS ABS: 0.1 10*3/uL (ref 0–0.7)
EOS PCT: 1 %
HCT: 41.5 % (ref 35.0–47.0)
HEMOGLOBIN: 13.9 g/dL (ref 12.0–16.0)
Lymphocytes Relative: 14 %
Lymphs Abs: 1.3 10*3/uL (ref 1.0–3.6)
MCH: 28.1 pg (ref 26.0–34.0)
MCHC: 33.6 g/dL (ref 32.0–36.0)
MCV: 83.7 fL (ref 80.0–100.0)
MONOS PCT: 8 %
Monocytes Absolute: 0.8 10*3/uL (ref 0.2–0.9)
NEUTROS PCT: 75 %
Neutro Abs: 7 10*3/uL — ABNORMAL HIGH (ref 1.4–6.5)
PLATELETS: 261 10*3/uL (ref 150–440)
RBC: 4.96 MIL/uL (ref 3.80–5.20)
RDW: 13.7 % (ref 11.5–14.5)
WBC: 9.4 10*3/uL (ref 3.6–11.0)

## 2017-09-17 LAB — ETHANOL: Alcohol, Ethyl (B): <10 mg/dL (ref <10)

## 2017-09-17 LAB — URINE DRUG SCREEN, QUALITATIVE (ARMC ONLY)
AMPHETAMINES, UR SCREEN: NOT DETECTED
Barbiturates, Ur Screen: NOT DETECTED
Benzodiazepine, Ur Scrn: NOT DETECTED
Cannabinoid 50 Ng, Ur ~~LOC~~: NOT DETECTED
Cocaine Metabolite,Ur ~~LOC~~: NOT DETECTED
MDMA (ECSTASY) UR SCREEN: NOT DETECTED
Methadone Scn, Ur: NOT DETECTED
Opiate, Ur Screen: NOT DETECTED
PHENCYCLIDINE (PCP) UR S: NOT DETECTED
TRICYCLIC, UR SCREEN: NOT DETECTED

## 2017-09-17 LAB — SALICYLATE LEVEL

## 2017-09-17 LAB — GLUCOSE, CAPILLARY
GLUCOSE-CAPILLARY: 237 mg/dL — AB (ref 65–99)
Glucose-Capillary: 138 mg/dL — ABNORMAL HIGH (ref 65–99)

## 2017-09-17 LAB — ACETAMINOPHEN LEVEL

## 2017-09-17 MED ORDER — GLIMEPIRIDE 4 MG PO TABS
4.0000 mg | ORAL_TABLET | Freq: Every day | ORAL | Status: DC
  Administered 2017-09-18: 4 mg via ORAL
  Filled 2017-09-17 (×2): qty 1

## 2017-09-17 MED ORDER — BISACODYL 10 MG RE SUPP: 10.0000 mg | Freq: Every day | RECTAL | Status: DC | PRN

## 2017-09-17 MED ORDER — POTASSIUM CHLORIDE IN NACL 20-0.9 MEQ/L-% IV SOLN
INTRAVENOUS | Status: DC
  Administered 2017-09-17 – 2017-09-18 (×2): via INTRAVENOUS
  Filled 2017-09-17 (×3): qty 1000

## 2017-09-17 MED ORDER — ACETAMINOPHEN 325 MG PO TABS: 650.0000 mg | ORAL_TABLET | Freq: Four times a day (QID) | ORAL | Status: DC | PRN

## 2017-09-17 MED ORDER — INSULIN ASPART 100 UNIT/ML ~~LOC~~ SOLN
0.0000 [IU] | Freq: Three times a day (TID) | SUBCUTANEOUS | Status: DC
  Administered 2017-09-17: 3 [IU] via SUBCUTANEOUS
  Administered 2017-09-18 (×2): 2 [IU] via SUBCUTANEOUS
  Administered 2017-09-19: 13:00:00 3 [IU] via SUBCUTANEOUS
  Filled 2017-09-17 (×4): qty 1

## 2017-09-17 MED ORDER — HEPARIN SODIUM (PORCINE) 5000 UNIT/ML IJ SOLN
5000.0000 [IU] | Freq: Three times a day (TID) | INTRAMUSCULAR | Status: DC
  Administered 2017-09-17 – 2017-09-20 (×9): 5000 [IU] via SUBCUTANEOUS
  Filled 2017-09-17 (×9): qty 1

## 2017-09-17 MED ORDER — PANTOPRAZOLE SODIUM 40 MG PO TBEC
40.0000 mg | DELAYED_RELEASE_TABLET | Freq: Every day | ORAL | Status: DC
  Administered 2017-09-17 – 2017-09-20 (×4): 40 mg via ORAL
  Filled 2017-09-17 (×4): qty 1

## 2017-09-17 MED ORDER — MEMANTINE HCL ER 28 MG PO CP24
28.0000 mg | ORAL_CAPSULE | Freq: Every day | ORAL | Status: DC
  Administered 2017-09-18 – 2017-09-20 (×3): 28 mg via ORAL
  Filled 2017-09-17 (×4): qty 1

## 2017-09-17 MED ORDER — LISINOPRIL 10 MG PO TABS
20.0000 mg | ORAL_TABLET | Freq: Every day | ORAL | Status: DC
  Administered 2017-09-18: 20 mg via ORAL
  Filled 2017-09-17: qty 2

## 2017-09-17 MED ORDER — LINAGLIPTIN 5 MG PO TABS
5.0000 mg | ORAL_TABLET | Freq: Every day | ORAL | Status: DC
  Administered 2017-09-18 – 2017-09-20 (×3): 5 mg via ORAL
  Filled 2017-09-17 (×4): qty 1

## 2017-09-17 MED ORDER — ONDANSETRON HCL 4 MG PO TABS: 4.0000 mg | ORAL_TABLET | Freq: Four times a day (QID) | ORAL | Status: DC | PRN

## 2017-09-17 MED ORDER — DOCUSATE SODIUM 100 MG PO CAPS
100.0000 mg | ORAL_CAPSULE | Freq: Two times a day (BID) | ORAL | Status: DC
  Administered 2017-09-17 – 2017-09-20 (×6): 100 mg via ORAL
  Filled 2017-09-17 (×6): qty 1

## 2017-09-17 MED ORDER — ONDANSETRON HCL 4 MG/2ML IJ SOLN: 4.0000 mg | Freq: Four times a day (QID) | INTRAMUSCULAR | Status: DC | PRN

## 2017-09-17 MED ORDER — ACETAMINOPHEN 650 MG RE SUPP: 650.0000 mg | Freq: Four times a day (QID) | RECTAL | Status: DC | PRN

## 2017-09-17 MED ORDER — SODIUM CHLORIDE 0.9% FLUSH
3.0000 mL | INTRAVENOUS | Status: DC | PRN
  Administered 2017-09-17 – 2017-09-18 (×2): 3 mL via INTRAVENOUS
  Filled 2017-09-17 (×2): qty 3

## 2017-09-17 MED ORDER — PRAVASTATIN SODIUM 40 MG PO TABS
40.0000 mg | ORAL_TABLET | Freq: Every day | ORAL | Status: DC
  Administered 2017-09-18: 08:00:00 40 mg via ORAL
  Filled 2017-09-17 (×2): qty 1

## 2017-09-17 MED ORDER — CLOPIDOGREL BISULFATE 75 MG PO TABS
75.0000 mg | ORAL_TABLET | Freq: Every day | ORAL | Status: DC
  Administered 2017-09-17 – 2017-09-20 (×4): 75 mg via ORAL
  Filled 2017-09-17 (×4): qty 1

## 2017-09-17 MED ORDER — METOPROLOL SUCCINATE ER 50 MG PO TB24
25.0000 mg | ORAL_TABLET | Freq: Two times a day (BID) | ORAL | Status: DC
  Administered 2017-09-17 – 2017-09-19 (×5): 25 mg via ORAL
  Filled 2017-09-17 (×5): qty 1

## 2017-09-17 NOTE — ED Provider Notes (Addendum)
[facility] Emergency Department Provider Note  ____________________________________________   I have reviewed the triage vital signs and the nursing notes. Where available I have reviewed prior notes and, if possible and indicated, outside hospital notes.    HISTORY  Chief Complaint Altered Mental Status    HPI Megan Arnold is a 83 y.o. female resents today complaining of nothing however family states that she has been gradually more demented over time she lives by herself but over the last for 5 days she has become acutely confused over baseline to the point where they do not think she can safely be at home.  She did fall apparently about a week ago with no obvious injury.  They are unsure if she is taking any of her medications.  Level 5 chart caveat; no further history available due to patient status.    Past Medical History:  Diagnosis Date  . Hypertension     Patient Active Problem List   Diagnosis Date Noted  . DIABETES MELLITUS, TYPE II 07/11/2006  . HYPERLIPIDEMIA 07/11/2006  . HYPERTENSION 07/11/2006  . VARICOSE VEINS, LOWER EXTREMITIES 07/11/2006  . VAGINITIS, ATROPHIC 07/11/2006  . OSTEOPENIA 07/11/2006  . HX, PERSONAL, PAST NONCOMPLIANCE 07/11/2006    History reviewed. No pertinent surgical history.  Prior to Admission medications   Medication Sig Start Date End Date Taking? Authorizing Provider  aspirin EC 81 MG tablet Take 81 mg by mouth daily.   Yes [provider]  bisacodyl (DULCOLAX) 5 MG EC tablet Take 10 mg by mouth daily as needed for moderate constipation.   Yes [provider]  Cyanocobalamin (VITAMIN B-12) 5000 MCG TBDP Take 5,000 Units by mouth daily.   Yes [provider]  glimepiride (AMARYL) 4 MG tablet Take 4 mg by mouth daily. 01/29/17  Yes [provider]  JANUVIA 100 MG tablet Take 100 mg by mouth daily.  01/29/17  Yes [provider]  lisinopril (PRINIVIL,ZESTRIL) 20  MG tablet Take 20 mg by mouth daily. 01/29/17  Yes [provider]  NAMENDA XR 28 MG CP24 24 hr capsule Take 28 mg by mouth daily. 02/09/17  Yes [provider]  pravastatin (PRAVACHOL) 40 MG tablet Take 40 mg by mouth daily. 02/09/17  Yes [provider]  Vitamins-Lipotropics (LIPOFLAVONOID) TABS Take by mouth as directed.   Yes [provider]  metoprolol succinate (TOPROL-XL) 25 MG 24 hr tablet Take 25 mg by mouth 2 (two) times daily.  02/09/17   [provider]    Allergies Penicillins  History reviewed. No pertinent family history.  Social History Social History   Tobacco Use  . Smoking status: Never Smoker  . Smokeless tobacco: Never Used  Substance Use Topics  . Alcohol use: No  . Drug use: Not on file    Review of Systems Constitutional: No fever/chills Eyes: No visual changes. ENT: No sore throat. No stiff neck no neck pain Cardiovascular: Denies chest pain. Respiratory: Denies shortness of breath. Gastrointestinal:   no vomiting.  No diarrhea.  No constipation. Genitourinary: Negative for dysuria. Musculoskeletal: Negative lower extremity swelling Skin: Negative for rash. Neurological: Negative for severe headaches, focal weakness or numbness.   ____________________________________________   PHYSICAL EXAM:  VITAL SIGNS: ED Triage Vitals  Enc Vitals Group     BP 09/17/17 1303 (!) 160/92     Pulse Rate 09/17/17 1303 98     Resp 09/17/17 1304 18     Temp 09/17/17 1304 97.9 F (36.6 C)  Temp Source 09/17/17 1304 Oral     SpO2 09/17/17 1303 96 %     Weight 09/17/17 1304 125 lb (56.7 kg)     Height 09/17/17 1304 5\' 3"  (1.6 m)     Head Circumference --      Peak Flow --      Pain Score --      Pain Loc --      Pain Edu? --      Excl. in GC? --     Constitutional: Alert and oriented to name only unclear on the date or where she is. Well appearing and in no acute distress. Eyes: Conjunctivae are normal Head:  Atraumatic HEENT: No congestion/rhinnorhea. Mucous membranes are moist.  Oropharynx non-erythematous Neck:   Nontender with no meningismus, no masses, no stridor Cardiovascular: Normal rate, regular rhythm. Grossly normal heart sounds.  Good peripheral circulation. Respiratory: Normal respiratory effort.  No retractions. Lungs CTAB. Abdominal: Soft and nontender. No distention. No guarding no rebound Back:  There is no focal tenderness or step off.  there is no midline tenderness there are no lesions noted. there is no CVA tenderness Musculoskeletal: No lower extremity tenderness, no upper extremity tenderness. No joint effusions, no DVT signs strong distal pulses no edema Neurologic:  Normal speech and language. No gross focal neurologic deficits are appreciated.  A limited neurologic exam based on patient compliance Skin:  Skin is warm, dry and intact. No rash noted. Psychiatric: Mood and affect are normal. Speech and behavior are normal.  ____________________________________________   LABS (all labs ordered are listed, but only abnormal results are displayed)  Labs Reviewed  COMPREHENSIVE METABOLIC PANEL - Abnormal; Notable for the following components:      Result Value   Potassium 3.2 (*)    Glucose, Bld 190 (*)    BUN 21 (*)    Creatinine, Ser 1.09 (*)    GFR calc non Af Amer 42 (*)    GFR calc Af Amer 49 (*)    All other components within normal limits  CBC WITH DIFFERENTIAL/PLATELET - Abnormal; Notable for the following components:   Neutro Abs 7.0 (*)    Basophils Absolute 0.2 (*)    All other components within normal limits  ACETAMINOPHEN LEVEL - Abnormal; Notable for the following components:   Acetaminophen (Tylenol), Serum <10 (*)    All other components within normal limits  ETHANOL  SALICYLATE LEVEL  URINE DRUG SCREEN, QUALITATIVE ([facility] ONLY)  URINALYSIS, COMPLETE (UACMP) WITH MICROSCOPIC    Pertinent labs  results that were available during my care of the  patient were reviewed by me and considered in my medical decision making (see chart for details). ____________________________________________  EKG  I personally interpreted any EKGs ordered by me or triage  ____________________________________________  RADIOLOGY  Pertinent labs & imaging results that were available during my care of the patient were reviewed by me and considered in my medical decision making (see chart for details). If possible, patient and/or family made aware of any abnormal findings.  Dg Chest 2 View  Result Date: 09/17/2017 CLINICAL DATA:  Altered mental status. EXAM: CHEST  2 VIEW COMPARISON:  None. FINDINGS: The cardiomediastinal silhouette is borderline enlarged. Normal pulmonary vascularity. Atherosclerotic calcification of the aortic arch. Small nodular density over the right lower lung likely represents a nipple shadow. Slightly coarsened bibasilar interstitial markings. No focal consolidation, pleural effusion, or pneumothorax. No acute osseous abnormality. IMPRESSION: 1. Small nodular density over the right lower lung likely represents a nipple  shadow. Consider repeat x-rays with nipple markers. 2. Otherwise, no active cardiopulmonary disease. Electronically Signed   By: Obie DredgeWilliam T Derry M.D.   On: 09/17/2017 14:41   Ct Head Wo Contrast  Result Date: 09/17/2017 CLINICAL DATA:  Mental status changes EXAM: CT HEAD WITHOUT CONTRAST TECHNIQUE: Contiguous axial images were obtained from the base of the skull through the vertex without intravenous contrast. COMPARISON:  None. FINDINGS: Brain: There is extensive low density within the gray matter of the medial left occipital lobe as well as the medial left temporal lobe. There is slight mass effect upon the occipital horn of the left lateral ventricle. There is no evidence of hemorrhage. There are chronic ischemic changes in the periventricular white matter. Old infarct in the left thalamus is present. Mild global atrophy  appropriate to age. Vascular: No hyperdense vessel or unexpected calcification. Skull: Cranium is intact. Sinuses/Orbits: No acute finding. Other: None. IMPRESSION: There is an infarct involving the medial left temporal and occipital lobes. Subacute age is favored. Exact age is indeterminate based on CT imaging. There is no evidence of hemorrhage. Chronic ischemic changes and atrophy are superimposed. Electronically Signed   By: Jolaine ClickArthur  Hoss M.D.   On: 09/17/2017 14:27   ____________________________________________    PROCEDURES  Procedure(s) performed: None  Procedures  Critical Care performed: None Atrial fibrillation rate 112, no acute ST elevation or depression normal axis ____________________________________________   INITIAL IMPRESSION / ASSESSMENT AND PLAN / ED COURSE  Pertinent labs & imaging results that were available during my care of the patient were reviewed by me and considered in my medical decision making (see chart for details).  83 year old woman with acute onset of confusion over baseline which has been acutely worsening over the last for 5 days.  CT scan shows a subacute infarct which is a correlate of probably with this onset of increased confusion.  Patient is not safe for discharge and as she has a new CVA will bring her in for further evaluation.    ____________________________________________   FINAL CLINICAL IMPRESSION(S) / ED DIAGNOSES  Final diagnoses:  None      This chart was dictated using voice recognition software.  Despite best efforts to proofread,  errors can occur which can change meaning.      Jeanmarie PlantMcShane, James A, MD 09/17/17 1505    Jeanmarie PlantMcShane, James A, MD 09/17/17 [phone]

## 2017-09-17 NOTE — Plan of Care (Signed)
Pt arrived with no saline lock in place. ED nurse Darl PikesSusan, restarted IV. Alert, rapid speech, flighty ideas, pleasant affect; unable to recall her BD or information given. Repeats same questions. TC with Dr. Judithann SheenSparks and he advised - no neuro consult or MRI since stroke already diagnosed. Safety sitter 1: 1 at bedside. No family with pt. Orders carried out; stroke handbook given. Pt unable to identify any pictures; was able to identify part of activities in family picture; unable to read any sentence/words-?vision impairment. IVF's started. Unknown what meds pt had today- meds started. Medications stored in pharmacy that were with pt.

## 2017-09-17 NOTE — ED Notes (Addendum)
Pt dressed out by 2 nurses. All of pt's medications (no controlled substances), shirt, bra, hair comb, yellow metal watch, cane, coat, pants, shoes, underwear, and yellow metal ring with red colored stone placed in belongings bag and labeled.

## 2017-09-17 NOTE — [place] FL2 (Signed)
[place] MEDICAID FL2 LEVEL OF CARE SCREENING TOOL     IDENTIFICATION  Patient Name: Megan Arnold Birthdate: 02/08/1921 Sex: female Admission Date (Current Location): 09/17/2017  [place]   Facility and Address:  [facility], [address], [place], [place] [address]      Provider Number: [id number]  Attending Physician Name and Address:  Marguarite ArbourSparks, Jeffrey D, MD  Relative Name and Phone Number:     [facility]  [phone]  Current Level of Care: Hospital Recommended Level of Care: SNF Prior Approval Number:    Date Approved/Denied:   PASRR Number:    Discharge Plan: SNF    Current Diagnoses: Patient Active Problem List   Diagnosis Date Noted  . CVA (cerebrovascular accident) (HCC) 09/17/2017  . Mixed Alzheimer's and vascular dementia 09/17/2017  . HTN (hypertension) 09/17/2017  . Diabetes (HCC) 09/17/2017  . CVA (cerebral vascular accident) (HCC) 09/17/2017  . DIABETES MELLITUS, TYPE II 07/11/2006  . HYPERLIPIDEMIA 07/11/2006  . HYPERTENSION 07/11/2006  . VARICOSE VEINS, LOWER EXTREMITIES 07/11/2006  . VAGINITIS, ATROPHIC 07/11/2006  . OSTEOPENIA 07/11/2006  . HX, PERSONAL, PAST NONCOMPLIANCE 07/11/2006    Orientation RESPIRATION BLADDER Height & Weight     Self, Time, Place  Normal Continent Weight: 125 lb (56.7 kg) Height:  5\' 3"  (160 cm)  BEHAVIORAL SYMPTOMS/MOOD NEUROLOGICAL BOWEL NUTRITION STATUS      Continent Diet(Normal)  AMBULATORY STATUS COMMUNICATION OF NEEDS Skin   Supervision Verbally Normal                       Personal Care Assistance Level of Assistance  Bathing, Feeding, Total care Bathing Assistance: Limited assistance Feeding assistance: Independent   Total Care Assistance: Limited assistance   Functional Limitations Info  Sight, Hearing, Speech Sight Info: Adequate Hearing Info: Impaired(Hard of hearing) Speech Info: Adequate    SPECIAL CARE FACTORS  FREQUENCY   Diabetes type 2                    Contractures Contractures Info: Not present    Additional Factors Info  Allergies   Allergies Info: Penicillins           Current Medications (09/17/2017):  This is the current hospital active medication list Current Facility-Administered Medications  Medication Dose Route Frequency Provider Last Rate Last Dose  . 0.9 % NaCl with KCl 20 mEq/ L  infusion   Intravenous Continuous Marguarite ArbourSparks, Jeffrey D, MD      . acetaminophen (TYLENOL) tablet 650 mg  650 mg Oral Q6H PRN Marguarite ArbourSparks, Jeffrey D, MD       Or  . acetaminophen (TYLENOL) suppository 650 mg  650 mg Rectal Q6H PRN Marguarite ArbourSparks, Jeffrey D, MD      . bisacodyl (DULCOLAX) suppository 10 mg  10 mg Rectal Daily PRN Marguarite ArbourSparks, Jeffrey D, MD      . clopidogrel (PLAVIX) tablet 75 mg  75 mg Oral Daily Marguarite ArbourSparks, Jeffrey D, MD      . docusate sodium (COLACE) capsule 100 mg  100 mg Oral BID Marguarite ArbourSparks, Jeffrey D, MD      . Melene Muller[START ON 09/18/2017] glimepiride (AMARYL) tablet 4 mg  4 mg Oral Q breakfast Marguarite ArbourSparks, Jeffrey D, MD      . heparin injection 5,000 Units  5,000 Units Subcutaneous Q8H Marguarite ArbourSparks, Jeffrey D, MD      . insulin aspart (novoLOG) injection 0-9 Units  0-9 Units Subcutaneous TID WC Marguarite ArbourSparks, Jeffrey D,  MD      . linagliptin (TRADJENTA) tablet 5 mg  5 mg Oral Daily Marguarite ArbourSparks, Jeffrey D, MD      . lisinopril (PRINIVIL,ZESTRIL) tablet 20 mg  20 mg Oral Daily Marguarite ArbourSparks, Jeffrey D, MD      . memantine (NAMENDA XR) 24 hr capsule 28 mg  28 mg Oral Daily Marguarite ArbourSparks, Jeffrey D, MD      . metoprolol succinate (TOPROL-XL) 24 hr tablet 25 mg  25 mg Oral BID Marguarite ArbourSparks, Jeffrey D, MD      . ondansetron [facility](ZOFRAN) tablet 4 mg  4 mg Oral Q6H PRN Marguarite ArbourSparks, Jeffrey D, MD       Or  . ondansetron (ZOFRAN) injection 4 mg  4 mg Intravenous Q6H PRN Marguarite ArbourSparks, Jeffrey D, MD      . pantoprazole (PROTONIX) EC tablet 40 mg  40 mg Oral Daily Marguarite ArbourSparks, Jeffrey D, MD      . pravastatin (PRAVACHOL) tablet 40 mg  40 mg Oral Daily Aram BeechamSparks, Jeffrey D, MD       . sodium chloride flush (NS) 0.9 % injection 3 mL  3 mL Intravenous PRN Marguarite ArbourSparks, Jeffrey D, MD   3 mL at 09/17/17 1627     Discharge Medications: Please see discharge summary for a list of discharge medications.  Relevant Imaging Results:  Relevant Lab Results:   Additional Information  SSN [id number]  Cheron SchaumannBandi, Texanna Hilburn M, [place]

## 2017-09-17 NOTE — Progress Notes (Signed)
**  Patient became irritated due to the room being cold. Nurse and I told patient that the room is on the warmest setting. I put three more blankets on the patient, patient stated that wasn't enough.  Patient then began to get out of bed to see that the setting was on warm. Patient stated that the setting need to put on hot, but that was explained to that option is not available on the thermostat . Patient then began to start yelling and screaming out the hallway about the cold. The nurse came over to patient to try to tell patient that setting was on warm. Patient became more upset, nurse called security. Security arrived try to talk to patient. Patient is redirected back into warm room. Patient is very confused as to whether she want the room warm or cool.

## 2017-09-17 NOTE — ED Notes (Signed)
ekg obtained with sw at bedside

## 2017-09-17 NOTE — Consult Note (Signed)
Megan Arnold is a 83 y.o. female  [id number]  Primary Cardiologist: Adrian Blackwater Reason for Consultation: Atrial fibrillation  HPI: This is a 83 year old white female with a past medical history of hypertension presented to the hospital with confusion and was found to have CVA and I was asked to evaluate the patient for atrial fibrillation. Patient denies any chest pain shortness of breath.   Review of Systems: No chest pain shortness of breath   Past Medical History:  Diagnosis Date  . Hypertension     Medications Prior to Admission  Medication Sig Dispense Refill  . aspirin EC 81 MG tablet Take 81 mg by mouth daily.    . bisacodyl (DULCOLAX) 5 MG EC tablet Take 10 mg by mouth daily as needed for moderate constipation.    . Cyanocobalamin (VITAMIN B-12) 5000 MCG TBDP Take 5,000 Units by mouth daily.    Marland Kitchen glimepiride (AMARYL) 4 MG tablet Take 4 mg by mouth daily.    Marland Kitchen JANUVIA 100 MG tablet Take 100 mg by mouth daily.     Marland Kitchen lisinopril (PRINIVIL,ZESTRIL) 20 MG tablet Take 20 mg by mouth daily.    Marland Kitchen NAMENDA XR 28 MG CP24 24 hr capsule Take 28 mg by mouth daily.    . pravastatin (PRAVACHOL) 40 MG tablet Take 40 mg by mouth daily.    . Vitamins-Lipotropics (LIPOFLAVONOID) TABS Take by mouth as directed.    . metoprolol succinate (TOPROL-XL) 25 MG 24 hr tablet Take 25 mg by mouth 2 (two) times daily.        . clopidogrel  75 mg Oral Daily  . docusate sodium  100 mg Oral BID  . [START ON 09/18/2017] glimepiride  4 mg Oral Q breakfast  . heparin  5,000 Units Subcutaneous Q8H  . insulin aspart  0-9 Units Subcutaneous TID WC  . linagliptin  5 mg Oral Daily  . lisinopril  20 mg Oral Daily  . memantine  28 mg Oral Daily  . metoprolol succinate  25 mg Oral BID  . pantoprazole  40 mg Oral Daily  . pravastatin  40 mg Oral Daily    Infusions: . 0.9 % NaCl with KCl 20 mEq / L 75 mL/hr at 09/17/17 1708    Allergies  Allergen Reactions  . Penicillins     Has patient had a  PCN reaction causing immediate rash, facial/tongue/throat swelling, SOB or lightheadedness with hypotension: Yes Has patient had a PCN reaction causing severe rash involving mucus membranes or skin necrosis: Yes Has patient had a PCN reaction that required hospitalization: Yes Has patient had a PCN reaction occurring within the last 10 years: No If all of the above answers are "NO", then may proceed with Cephalosporin use.     Social History   Socioeconomic History  . Marital status: Divorced    Spouse name: Not on file  . Number of children: Not on file  . Years of education: Not on file  . Highest education level: Not on file  Social Needs  . Financial resource strain: Not on file  . Food insecurity - worry: Not on file  . Food insecurity - inability: Not on file  . Transportation needs - medical: Not on file  . Transportation needs - non-medical: Not on file  Occupational History  . Not on file  Tobacco Use  . Smoking status: Never Smoker  . Smokeless tobacco: Never Used  Substance and Sexual Activity  . Alcohol use: No  . Drug  use: Not on file  . Sexual activity: Not on file  Other Topics Concern  . Not on file  Social History Narrative  . Not on file    History reviewed. No pertinent family history.  PHYSICAL EXAM: Vitals:   09/17/17 1304 09/17/17 1620  BP: (!) 160/92 (!) 165/76  Pulse: (!) 116 (!) 103  Resp: 18 18  Temp: 97.9 F (36.6 C)   SpO2: 100% 100%     Intake/Output Summary (Last 24 hours) at 09/17/2017 1822 Last data filed at 09/17/2017 1810 Gross per 24 hour  Intake 117 ml  Output -  Net 117 ml    General:  Well appearing. No respiratory difficulty HEENT: normal Neck: supple. no JVD. Carotids 2+ bilat; no bruits. No lymphadenopathy or thryomegaly appreciated. Cor: PMI nondisplaced. Regular rate & rhythm. No rubs, gallops or murmurs. Lungs: clear Abdomen: soft, nontender, nondistended. No hepatosplenomegaly. No bruits or masses. Good bowel  sounds. Extremities: no cyanosis, clubbing, rash, edema Neuro: alert & oriented x 3, cranial nerves grossly intact. moves all 4 extremities w/o difficulty. Affect pleasant.  ECG: No EKG in chart  Results for orders placed or performed during the hospital encounter of 09/17/17 (from the past 24 hour(s))  Urine Drug Screen, Qualitative ([facility] only)     Status: None   Collection Time: 09/17/17  1:10 PM  Result Value Ref Range   Tricyclic, Ur Screen NONE DETECTED NONE DETECTED   Amphetamines, Ur Screen NONE DETECTED NONE DETECTED   MDMA (Ecstasy)Ur Screen NONE DETECTED NONE DETECTED   Cocaine Metabolite,Ur [place] NONE DETECTED NONE DETECTED   Opiate, Ur Screen NONE DETECTED NONE DETECTED   Phencyclidine (PCP) Ur S NONE DETECTED NONE DETECTED   Cannabinoid 50 Ng, Ur [place] NONE DETECTED NONE DETECTED   Barbiturates, Ur Screen NONE DETECTED NONE DETECTED   Benzodiazepine, Ur Scrn NONE DETECTED NONE DETECTED   Methadone Scn, Ur NONE DETECTED NONE DETECTED  Urinalysis, Complete w Microscopic     Status: Abnormal   Collection Time: 09/17/17  1:10 PM  Result Value Ref Range   Color, Urine AMBER (A) YELLOW   APPearance CLOUDY (A) CLEAR   Specific Gravity, Urine 1.023 1.005 - 1.030   pH 5.0 5.0 - 8.0   Glucose, UA NEGATIVE NEGATIVE mg/dL   Hgb urine dipstick NEGATIVE NEGATIVE   Bilirubin Urine NEGATIVE NEGATIVE   Ketones, ur 5 (A) NEGATIVE mg/dL   Protein, ur 30 (A) NEGATIVE mg/dL   Nitrite NEGATIVE NEGATIVE   Leukocytes, UA LARGE (A) NEGATIVE   RBC / HPF 6-30 0 - 5 RBC/hpf   WBC, UA TOO NUMEROUS TO COUNT 0 - 5 WBC/hpf   Bacteria, UA FEW (A) NONE SEEN   Squamous Epithelial / LPF 6-30 (A) NONE SEEN   Mucus PRESENT    Hyaline Casts, UA PRESENT    Ca Oxalate Crys, UA PRESENT   Comprehensive metabolic panel     Status: Abnormal   Collection Time: 09/17/17  1:10 PM  Result Value Ref Range   Sodium 135 135 - 145 mmol/L   Potassium 3.2 (L) 3.5 - 5.1 mmol/L   Chloride 101 101 - 111 mmol/L   CO2 25  22 - 32 mmol/L   Glucose, Bld 190 (H) 65 - 99 mg/dL   BUN 21 (H) 6 - 20 mg/dL   Creatinine, Ser 1.611.09 (H) 0.44 - 1.00 mg/dL   Calcium 9.2 8.9 - 09.610.3 mg/dL   Total Protein 7.9 6.5 - 8.1 g/dL   Albumin 4.5 3.5 - 5.0  g/dL   AST 28 15 - 41 U/L   ALT 17 14 - 54 U/L   Alkaline Phosphatase 65 38 - 126 U/L   Total Bilirubin 1.1 0.3 - 1.2 mg/dL   GFR calc non Af Amer 42 (L) >60 mL/min   GFR calc Af Amer 49 (L) >60 mL/min   Anion gap 9 5 - 15  Ethanol     Status: None   Collection Time: 09/17/17  1:10 PM  Result Value Ref Range   Alcohol, Ethyl (B) <10 <10 mg/dL  CBC with Diff     Status: Abnormal   Collection Time: 09/17/17  1:10 PM  Result Value Ref Range   WBC 9.4 3.6 - 11.0 K/uL   RBC 4.96 3.80 - 5.20 MIL/uL   Hemoglobin 13.9 12.0 - 16.0 g/dL   HCT 16.1 09.6 - 04.5 %   MCV 83.7 80.0 - 100.0 fL   MCH 28.1 26.0 - 34.0 pg   MCHC 33.6 32.0 - 36.0 g/dL   RDW 40.9 81.1 - 91.4 %   Platelets 261 150 - 440 K/uL   Neutrophils Relative % 75 %   Neutro Abs 7.0 (H) 1.4 - 6.5 K/uL   Lymphocytes Relative 14 %   Lymphs Abs 1.3 1.0 - 3.6 K/uL   Monocytes Relative 8 %   Monocytes Absolute 0.8 0.2 - 0.9 K/uL   Eosinophils Relative 1 %   Eosinophils Absolute 0.1 0 - 0.7 K/uL   Basophils Relative 2 %   Basophils Absolute 0.2 (H) 0 - 0.1 K/uL  Acetaminophen level     Status: Abnormal   Collection Time: 09/17/17  1:10 PM  Result Value Ref Range   Acetaminophen (Tylenol), Serum <10 (L) 10 - 30 ug/mL  Salicylate level     Status: None   Collection Time: 09/17/17  1:10 PM  Result Value Ref Range   Salicylate Lvl <7.0 2.8 - 30.0 mg/dL  Glucose, capillary     Status: Abnormal   Collection Time: 09/17/17  5:04 PM  Result Value Ref Range   Glucose-Capillary 237 (H) 65 - 99 mg/dL   Dg Chest 2 View  Result Date: 09/17/2017 CLINICAL DATA:  Altered mental status. EXAM: CHEST  2 VIEW COMPARISON:  None. FINDINGS: The cardiomediastinal silhouette is borderline enlarged. Normal pulmonary vascularity.  Atherosclerotic calcification of the aortic arch. Small nodular density over the right lower lung likely represents a nipple shadow. Slightly coarsened bibasilar interstitial markings. No focal consolidation, pleural effusion, or pneumothorax. No acute osseous abnormality. IMPRESSION: 1. Small nodular density over the right lower lung likely represents a nipple shadow. Consider repeat x-rays with nipple markers. 2. Otherwise, no active cardiopulmonary disease. Electronically Signed   By: Obie Dredge M.D.   On: 09/17/2017 14:41   Ct Head Wo Contrast  Result Date: 09/17/2017 CLINICAL DATA:  Mental status changes EXAM: CT HEAD WITHOUT CONTRAST TECHNIQUE: Contiguous axial images were obtained from the base of the skull through the vertex without intravenous contrast. COMPARISON:  None. FINDINGS: Brain: There is extensive low density within the gray matter of the medial left occipital lobe as well as the medial left temporal lobe. There is slight mass effect upon the occipital horn of the left lateral ventricle. There is no evidence of hemorrhage. There are chronic ischemic changes in the periventricular white matter. Old infarct in the left thalamus is present. Mild global atrophy appropriate to age. Vascular: No hyperdense vessel or unexpected calcification. Skull: Cranium is intact. Sinuses/Orbits: No acute finding. Other: None.  IMPRESSION: There is an infarct involving the medial left temporal and occipital lobes. Subacute age is favored. Exact age is indeterminate based on CT imaging. There is no evidence of hemorrhage. Chronic ischemic changes and atrophy are superimposed. Electronically Signed   By: Jolaine ClickArthur  Hoss M.D.   On: 09/17/2017 14:27   [place] Carotid Bilateral  Result Date: 09/17/2017 CLINICAL DATA:  Cerebrovascular accident EXAM: BILATERAL CAROTID DUPLEX ULTRASOUND TECHNIQUE: Wallace CullensGray scale imaging, color Doppler and duplex ultrasound were performed of bilateral carotid and vertebral arteries in the  neck. COMPARISON:  None. FINDINGS: Criteria: Quantification of carotid stenosis is based on velocity parameters that correlate the residual internal carotid diameter with NASCET-based stenosis levels, using the diameter of the distal internal carotid lumen as the denominator for stenosis measurement. The following velocity measurements were obtained: RIGHT ICA:  50/17 cm/sec CCA:  80/6 cm/sec SYSTOLIC ICA/CCA RATIO:  0.63 DIASTOLIC ICA/CCA RATIO:  2.8 ECA:  88 cm/sec LEFT ICA:  43/11 cm/sec CCA:  104/12 cm/sec SYSTOLIC ICA/CCA RATIO:  0.41 DIASTOLIC ICA/CCA RATIO:  0.91 ECA:  85 cm/sec RIGHT CAROTID ARTERY: Preliminary grayscale images demonstrate some intimal thickening within the common carotid artery. No significant atherosclerotic plaque is noted. Waveforms, velocities and flow velocity ratios show no evidence of focal hemodynamically significant stenosis. RIGHT VERTEBRAL ARTERY:  Antegrade in nature LEFT CAROTID ARTERY: Preliminary grayscale images demonstrate mild intimal thickening within the common carotid artery. Minimal plaque is noted in the region of the carotid bulb. The waveforms, velocities and flow velocity ratios show no evidence of focal hemodynamically significant stenosis. LEFT VERTEBRAL ARTERY:  Antegrade in nature. IMPRESSION: Mild atherosclerotic changes without focal hemodynamically significant stenosis. Electronically Signed   By: Alcide CleverMark  Lukens M.D.   On: 09/17/2017 16:50     ASSESSMENT AND PLAN: Status post CVA in the medial left temporal and occipital lobes with confusion and syncope. Will order EKG and echocardiogram.  Megan Arnold A

## 2017-09-17 NOTE — ED Notes (Signed)
Pt's ct shows stroke - pt's symptoms per grandson started acutely 4 days ago. They believed it was her worsening dementia and brought her in for placement that handles geriatric and dementia.

## 2017-09-17 NOTE — ED Notes (Signed)
Pt in the hallway for safety - grandson stated he couldn't stay and left before sitter could be found.

## 2017-09-17 NOTE — ED Triage Notes (Signed)
Pt presents to ED via [facility] from home c/o AMS increasing over last 2 days per family. EMS report pt lives alone, has family as neighbors. Family told EMS they are working with a Child psychotherapistsocial worker to find assisted living for pt but were told there would be no progress on placement until after the holidays and they could not watch pt until then. Pt is confused but cooperative, has no complaints at this time.

## 2017-09-17 NOTE — ED Notes (Signed)
Susan RN, aware of bed assigned 

## 2017-09-17 NOTE — ED Notes (Signed)
Pt is to go to [facility] for geropsych placement when a room becomes available. Lucila MaineGrandson has arrived and will be in the room for safety.

## 2017-09-17 NOTE — Clinical Social Work Note (Signed)
Clinical Social Work Assessment  Patient Details  Name: Megan Arnold Coderre MRN: [id number] Date of Birth: 03/30/1921  Date of referral:  09/17/17               Reason for consult:  Facility Placement                Permission sought to share information with:  Family Supports, Magazine features editoracility Contact Representative Permission granted to share information::  Yes, Verbal Permission Granted  Name::     Alvan DameMaxine Hatley [phone]  grandson [phone]  Agency::     Relationship::  family  Contact Information:  family and facilities  Housing/Transportation Living arrangements for the past 2 months:  Apartment Source of Information:  Patient, Adult Children Patient Interpreter Needed:  None Criminal Activity/Legal Involvement Pertinent to Current Situation/Hospitalization:  No - Comment as needed Significant Relationships:  Adult Children Lives with:  Self Do you feel safe going back to the place where you live?  Yes Need for family participation in patient care:  Yes (Comment)  Care giving concerns: TBD unable to reach Grandson or daughter   Social Worker assessment / plan: Megan Arnold Tugman is a 83 y.o. female has a past medical history significant for HTN and dementia who lives alone brought to ER by family for worsening confusion and "placement". In ER, pt found to have new/acute CVA on CT. She is now admitted. No fever. Family denies CP or SOb. No N/V/D. LCSW introduced myself to patient who was very reactive, loud, and exhibited pressured speech. In line of questions patient struggled to answer, and follow, it was if she new the answer but repeatedly stated she cant think of it now or remember- She has 2 gransdon's but could only remember one Jomarie LongsJoseph. Patient reports she has always been independent and was able to cook and clean for myself and now it seems her daughter comes and does it all. She does admit she has not driven in a while. Grandson and daughter were called LCSW unable to  colloberate information given by patient with family. Messages left awaiting call back. Patient reports she is able to walk using a cane, can feed herself she answered this question already ( delayed responses)( no memory she answered already) Spoke to patients daughter: 4 pm and was informed her mother was very independent up to 5 days ago. Patient is HOH hard of hearing and speaks loudly as a result.  The family resides next door to patient and have been very concerned and called DSS and was referred to Angie FavaCaressa Thaxton [phone] to assist with finding patient ALF. LCSW explained that patient has been admitted and will have a medical work up done and will assist with placement options on Monday. Daughter Teryl LucyMaxine reports that Ms Evette Cristalhaxtons might have a place and requested the weekday SW to follow up. In the meantime LCSW will complete assessment and Fl2 started and weekday LCSW will assist family.      Employment status:  Retired Health and safety inspectornsurance information:  Medicare PT Recommendations:  Not assessed at this time Information / Referral to community resources:  Skilled Nursing Facility  Patient/Family's Response to care: Patient reports she knows she is not herself now for a while " Im not remembering things"  Patient/Family's Understanding of and Emotional Response to Diagnosis, Current Treatment, and Prognosis:  Patient is glad to be admitted and is easily redirectable when she starts up again with her " loud pressured speaking"  Emotional Assessment Appearance:  Appears younger than stated  age Attitude/Demeanor/Rapport:  Inconsistent, Reactive, Other(Patient talks very loudly but is not hard oh hearing and very animated and pressured speech) Affect (typically observed):  Restless, Anxious, Happy Orientation:    Alcohol / Substance use:  Not Applicable Psych involvement (Current and /or in the community):  No (Comment)  Discharge Needs  Concerns to be addressed:  Discharge Planning  Concerns Readmission within the last 30 days:    Current discharge risk:  Lives alone Barriers to Discharge:  Continued Medical Work up   Cheron SchaumannBandi, Zariana Strub M, LCSW 09/17/2017, 3:40 PM

## 2017-09-17 NOTE — H&P (Signed)
History and Physical    Megan Arnold ZOX:[id number] DOB: 10/25/1920 DOA: 09/17/2017  Referring physician: Dr. Alphonzo LemmingsMcShane PCP: Yetta BarreSastry, Sangeeta (Inactive)  Specialists: none  Chief Complaint: confusion  HPI: Megan Arnold is a 83 y.o. female has a past medical history significant for HTN and dementia who lives alone brought to ER by family for worsening confusion and "placement". In ER, pt found to have new/acute CVA on CT. She is now admitted. No fever. Family denies CP or SOb. No N/V/D.  Review of Systems: unable to obtain due to confusion  Past Medical History:  Diagnosis Date  . Hypertension    History reviewed. No pertinent surgical history. Social History:  reports that  has never smoked. she has never used smokeless tobacco. She reports that she does not drink alcohol. Her drug history is not on file.  Allergies  Allergen Reactions  . Penicillins     Has patient had a PCN reaction causing immediate rash, facial/tongue/throat swelling, SOB or lightheadedness with hypotension: Yes Has patient had a PCN reaction causing severe rash involving mucus membranes or skin necrosis: Yes Has patient had a PCN reaction that required hospitalization: Yes Has patient had a PCN reaction occurring within the last 10 years: No If all of the above answers are "NO", then may proceed with Cephalosporin use.     History reviewed. No pertinent family history.  Prior to Admission medications   Medication Sig Start Date End Date Taking? Authorizing Provider  aspirin EC 81 MG tablet Take 81 mg by mouth daily.   Yes [provider]  bisacodyl (DULCOLAX) 5 MG EC tablet Take 10 mg by mouth daily as needed for moderate constipation.   Yes [provider]  Cyanocobalamin (VITAMIN B-12) 5000 MCG TBDP Take 5,000 Units by mouth daily.   Yes [provider]  glimepiride (AMARYL) 4 MG tablet Take 4 mg by mouth daily. 01/29/17  Yes [provider]  JANUVIA 100 MG  tablet Take 100 mg by mouth daily.  01/29/17  Yes [provider]  lisinopril (PRINIVIL,ZESTRIL) 20 MG tablet Take 20 mg by mouth daily. 01/29/17  Yes [provider]  NAMENDA XR 28 MG CP24 24 hr capsule Take 28 mg by mouth daily. 02/09/17  Yes [provider]  pravastatin (PRAVACHOL) 40 MG tablet Take 40 mg by mouth daily. 02/09/17  Yes [provider]  Vitamins-Lipotropics (LIPOFLAVONOID) TABS Take by mouth as directed.   Yes [provider]  metoprolol succinate (TOPROL-XL) 25 MG 24 hr tablet Take 25 mg by mouth 2 (two) times daily.  02/09/17   [provider]   Physical Exam: Vitals:   09/17/17 1303 09/17/17 1304  BP: (!) 160/92 (!) 160/92  Pulse: 98 (!) 116  Resp:  18  Temp:  97.9 F (36.6 C)  TempSrc:  Oral  SpO2: 96% 100%  Weight:  56.7 kg (125 lb)  Height:  5\' 3"  (1.6 m)     General:  No apparent distress, WDWN, [place]/AT  Eyes: PERRL, EOMI, no scleral icterus, conjunctiva clear  ENT: moist oropharynx without exudate, TM's benign, dentition poor  Neck: supple, no lymphadenopathy. Bilateral bruits noted. No thyromegaly  Cardiovascular: regular rate without MRG; 2+ peripheral pulses, no JVD, no peripheral edema  Respiratory: CTA biL, good air movement without wheezing, rhonchi or crackled. Respiratory effort normal  Abdomen: soft, non tender to palpation, positive bowel sounds, no guarding, no rebound  Skin: no rashes or lesions  Musculoskeletal: normal bulk and tone, no  joint swelling  Psychiatric: normal mood and affect , alert but oriented to person only  Neurologic: CN 2-12 grossly intact, Motor strength 5/5 in all 4 groups with symmetric DTR's and non-focal sensory exam  Labs on Admission:  Basic Metabolic Panel: Recent Labs  Lab 09/17/17 1310  NA 135  K 3.2*  CL 101  CO2 25  GLUCOSE 190*  BUN 21*  CREATININE 1.09*  CALCIUM 9.2   Liver Function Tests: Recent Labs  Lab 09/17/17 1310  AST 28  ALT 17   ALKPHOS 65  BILITOT 1.1  PROT 7.9  ALBUMIN 4.5   No results for input(s): LIPASE, AMYLASE in the last 168 hours. No results for input(s): AMMONIA in the last 168 hours. CBC: Recent Labs  Lab 09/17/17 1310  WBC 9.4  NEUTROABS 7.0*  HGB 13.9  HCT 41.5  MCV 83.7  PLT 261   Cardiac Enzymes: No results for input(s): CKTOTAL, CKMB, CKMBINDEX, TROPONINI in the last 168 hours.  BNP (last 3 results) No results for input(s): BNP in the last 8760 hours.  ProBNP (last 3 results) No results for input(s): PROBNP in the last 8760 hours.  CBG: No results for input(s): GLUCAP in the last 168 hours.  Radiological Exams on Admission: Dg Chest 2 View  Result Date: 09/17/2017 CLINICAL DATA:  Altered mental status. EXAM: CHEST  2 VIEW COMPARISON:  None. FINDINGS: The cardiomediastinal silhouette is borderline enlarged. Normal pulmonary vascularity. Atherosclerotic calcification of the aortic arch. Small nodular density over the right lower lung likely represents a nipple shadow. Slightly coarsened bibasilar interstitial markings. No focal consolidation, pleural effusion, or pneumothorax. No acute osseous abnormality. IMPRESSION: 1. Small nodular density over the right lower lung likely represents a nipple shadow. Consider repeat x-rays with nipple markers. 2. Otherwise, no active cardiopulmonary disease. Electronically Signed   By: Obie DredgeWilliam T Derry M.D.   On: 09/17/2017 14:41   Ct Head Wo Contrast  Result Date: 09/17/2017 CLINICAL DATA:  Mental status changes EXAM: CT HEAD WITHOUT CONTRAST TECHNIQUE: Contiguous axial images were obtained from the base of the skull through the vertex without intravenous contrast. COMPARISON:  None. FINDINGS: Brain: There is extensive low density within the gray matter of the medial left occipital lobe as well as the medial left temporal lobe. There is slight mass effect upon the occipital horn of the left lateral ventricle. There is no evidence of hemorrhage. There  are chronic ischemic changes in the periventricular white matter. Old infarct in the left thalamus is present. Mild global atrophy appropriate to age. Vascular: No hyperdense vessel or unexpected calcification. Skull: Cranium is intact. Sinuses/Orbits: No acute finding. Other: None. IMPRESSION: There is an infarct involving the medial left temporal and occipital lobes. Subacute age is favored. Exact age is indeterminate based on CT imaging. There is no evidence of hemorrhage. Chronic ischemic changes and atrophy are superimposed. Electronically Signed   By: Jolaine ClickArthur  Hoss M.D.   On: 09/17/2017 14:27    EKG: Independently reviewed.  Assessment/Plan Principal Problem:   CVA (cerebrovascular accident) [facility](HCC) Active Problems:   Mixed Alzheimer's and vascular dementia   HTN (hypertension)   Diabetes (HCC)   Will admit to floor and monitor BP closely. Consult ST, PT, and CSW. Begin Plavix. Echo and Carotids ordered. Will need placement  Diet: soft Fluids: NS with K+@75  DVT Prophylaxis: SQ Heparin  Code Status: FULL  Family Communication: none  Disposition Plan: SNF  Time spent: 50 min

## 2017-09-18 ENCOUNTER — Inpatient Hospital Stay: Admit: 2017-09-18 | Payer: Medicare Other

## 2017-09-18 DIAGNOSIS — F0150 Vascular dementia without behavioral disturbance: Secondary | ICD-10-CM

## 2017-09-18 DIAGNOSIS — F0280 Dementia in other diseases classified elsewhere without behavioral disturbance: Secondary | ICD-10-CM

## 2017-09-18 DIAGNOSIS — G309 Alzheimer's disease, unspecified: Secondary | ICD-10-CM

## 2017-09-18 LAB — GLUCOSE, CAPILLARY
GLUCOSE-CAPILLARY: 152 mg/dL — AB (ref 65–99)
GLUCOSE-CAPILLARY: 174 mg/dL — AB (ref 65–99)
GLUCOSE-CAPILLARY: 57 mg/dL — AB (ref 65–99)
GLUCOSE-CAPILLARY: 65 mg/dL (ref 65–99)
Glucose-Capillary: 116 mg/dL — ABNORMAL HIGH (ref 65–99)
Glucose-Capillary: 140 mg/dL — ABNORMAL HIGH (ref 65–99)

## 2017-09-18 LAB — COMPREHENSIVE METABOLIC PANEL
ALK PHOS: 52 U/L (ref 38–126)
ALT: 12 U/L — AB (ref 14–54)
ANION GAP: 8 (ref 5–15)
AST: 22 U/L (ref 15–41)
Albumin: 3.5 g/dL (ref 3.5–5.0)
BILIRUBIN TOTAL: 1 mg/dL (ref 0.3–1.2)
BUN: 19 mg/dL (ref 6–20)
CALCIUM: 8.5 mg/dL — AB (ref 8.9–10.3)
CO2: 22 mmol/L (ref 22–32)
CREATININE: 1 mg/dL (ref 0.44–1.00)
Chloride: 109 mmol/L (ref 101–111)
GFR calc non Af Amer: 47 mL/min — ABNORMAL LOW (ref >60)
GFR, EST AFRICAN AMERICAN: 54 mL/min — AB (ref >60)
Glucose, Bld: 156 mg/dL — ABNORMAL HIGH (ref 65–99)
Potassium: 3.2 mmol/L — ABNORMAL LOW (ref 3.5–5.1)
Sodium: 139 mmol/L (ref 135–145)
TOTAL PROTEIN: 6.5 g/dL (ref 6.5–8.1)

## 2017-09-18 LAB — CBC
HCT: 37.2 % (ref 35.0–47.0)
HEMOGLOBIN: 12.8 g/dL (ref 12.0–16.0)
MCH: 28.8 pg (ref 26.0–34.0)
MCHC: 34.4 g/dL (ref 32.0–36.0)
MCV: 83.7 fL (ref 80.0–100.0)
PLATELETS: 221 10*3/uL (ref 150–440)
RBC: 4.44 MIL/uL (ref 3.80–5.20)
RDW: 13.5 % (ref 11.5–14.5)
WBC: 8.5 10*3/uL (ref 3.6–11.0)

## 2017-09-18 MED ORDER — CIPROFLOXACIN IN D5W 400 MG/200ML IV SOLN
400.0000 mg | INTRAVENOUS | Status: DC
  Administered 2017-09-18 – 2017-09-19 (×2): 400 mg via INTRAVENOUS
  Filled 2017-09-18 (×2): qty 200

## 2017-09-18 MED ORDER — QUETIAPINE FUMARATE 25 MG PO TABS
25.0000 mg | ORAL_TABLET | Freq: Every day | ORAL | Status: DC
  Administered 2017-09-18 – 2017-09-19 (×2): 25 mg via ORAL
  Filled 2017-09-18 (×2): qty 1

## 2017-09-18 MED ORDER — ATORVASTATIN CALCIUM 20 MG PO TABS
40.0000 mg | ORAL_TABLET | Freq: Every day | ORAL | Status: DC
  Administered 2017-09-18 – 2017-09-19 (×2): 40 mg via ORAL
  Filled 2017-09-18 (×2): qty 2

## 2017-09-18 MED ORDER — ASPIRIN EC 81 MG PO TBEC
81.0000 mg | DELAYED_RELEASE_TABLET | Freq: Every day | ORAL | Status: DC
  Administered 2017-09-18 – 2017-09-20 (×3): 81 mg via ORAL
  Filled 2017-09-18 (×3): qty 1

## 2017-09-18 MED ORDER — POTASSIUM CHLORIDE CRYS ER 20 MEQ PO TBCR
40.0000 meq | EXTENDED_RELEASE_TABLET | Freq: Once | ORAL | Status: AC
  Administered 2017-09-18: 15:00:00 40 meq via ORAL
  Filled 2017-09-18: qty 2

## 2017-09-18 MED ORDER — LISINOPRIL 10 MG PO TABS
10.0000 mg | ORAL_TABLET | Freq: Every day | ORAL | Status: DC
  Administered 2017-09-19 – 2017-09-20 (×2): 10 mg via ORAL
  Filled 2017-09-18 (×2): qty 1

## 2017-09-18 MED ORDER — HALOPERIDOL LACTATE 5 MG/ML IJ SOLN: 1.0000 mg | Freq: Four times a day (QID) | INTRAMUSCULAR | Status: DC | PRN

## 2017-09-18 NOTE — Evaluation (Signed)
Physical Therapy Evaluation Patient Details Name: Megan Arnold MRN: [id number] DOB: 1921-05-28 Today's Date: 09/18/2017   History of Present Illness  Pt is a 83 y/o F who presented with worsening confusion (pt with dementia at baseline).  CT head showed new/acute CVA.  Pt's PMH includes mixed Alzheimer's and vascular dementia.     Clinical Impression  Pt admitted with above diagnosis. Pt currently with functional limitations due to the deficits listed below (see PT Problem List). Ms. Schier is from home where she lives alone.  No family present to provide information on PLOF or home layout but suspect pt was using [facility] as it is in the pt's room.  Pt demonstrates BUE and BLE weakness (L>R).  She demonstrates unsteadiness and requires min assist for transfers and when ambulating to prevent LOB and fall.   5xSTS: 18.30 sec suggesting impaired balance and BLE strength. Given pt's current mobility status, recommending SNF at d/c.    Pt will benefit from skilled PT to increase their independence and safety with mobility to allow discharge to the venue listed below.      Follow Up Recommendations SNF    Equipment Recommendations  None recommended by PT    Recommendations for Other Services       Precautions / Restrictions Precautions Precautions: Fall Restrictions Weight Bearing Restrictions: No      Mobility  Bed Mobility Overal bed mobility: Needs Assistance Bed Mobility: Supine to Sit;Sit to Supine     Supine to sit: Min guard Sit to supine: Min guard   General bed mobility comments: Pt moves quickly with posterior LOB with recovery x1 with supine>sit.  Pt requires cues for positioning when returning to supine.   Transfers Overall transfer level: Needs assistance Equipment used: Straight cane Transfers: Sit to/from Stand Sit to Stand: Min assist         General transfer comment: Pt braces posterior BLEs against side of bed to stabilize with sit>stand with min  guard>min assist due to unsteadiness.   Ambulation/Gait Ambulation/Gait assistance: Min assist Ambulation Distance (Feet): 60 Feet Assistive device: Straight cane Gait Pattern/deviations: Step-through pattern;Staggering right;Staggering left;Narrow base of support   Gait velocity interpretation: at or above normal speed for age/gender General Gait Details: Pt with narrow BOS and staggers L and R occasionally, requiring min assist to steady.    Stairs            Wheelchair Mobility    Modified Rankin (Stroke Patients Only)       Balance Overall balance assessment: Needs assistance Sitting-balance support: No upper extremity supported;Feet supported Sitting balance-Leahy Scale: Fair     Standing balance support: No upper extremity supported;During functional activity Standing balance-Leahy Scale: Fair Standing balance comment: Pt able to stand statically without UE support for short period of time, otherwise requires UE support Single Leg Stance - Right Leg: (unable to perform without UE support) Single Leg Stance - Left Leg: (unable to perform without UE support)         High level balance activites: Backward walking;Direction changes;Turns;Sudden stops;Head turns High Level Balance Comments: Instability with higher level balance activities requiring assist to prevent LOB with horizontal head turns and backward walking             Pertinent Vitals/Pain Pain Assessment: No/denies pain    Home Living Family/patient expects to be discharged to:: Skilled nursing facility                 Additional Comments: Pt unable to provide  information regarding home layout, no family present.     Prior Function Level of Independence: Independent with assistive device(s)         Comments: Pt unable to provide information regarding home layout, no family present. From chart review it seems that pt was living alone and ambulating with SPC at baseline.      Hand  Dominance        Extremity/Trunk Assessment   Upper Extremity Assessment Upper Extremity Assessment: RUE deficits/detail;LUE deficits/detail RUE Deficits / Details: Strength grossly 4/5 LUE Deficits / Details: Strength grossly 3/5 LUE Coordination: decreased gross motor(pt hitting her face as she tries to do this quickly)    Lower Extremity Assessment Lower Extremity Assessment: RLE deficits/detail;LLE deficits/detail RLE Deficits / Details: Strength grossly 4/5  RLE Sensation: (WNL) RLE Coordination: decreased gross motor(pt hitting her face as she tries to do finger to nose quickl) LLE Deficits / Details: Strength grossly 4/5 LLE Sensation: (WNL) LLE Coordination: (pt hitting her face as she tries to do finger to nose quickl)       Communication   Communication: No difficulties  Cognition Arousal/Alertness: Awake/alert Behavior During Therapy: Restless Overall Cognitive Status: History of cognitive impairments - at baseline                                 General Comments: Per chart review, dementia at baseline.       General Comments General comments (skin integrity, edema, etc.): Eyes closed in normal stance with increased postural sway requiring close min guard.  5xSTS: 18.30 sec suggesting impaired balance and BLE strength.     Exercises     Assessment/Plan    PT Assessment Patient needs continued PT services  PT Problem List Decreased strength;Decreased activity tolerance;Decreased balance;Decreased mobility;Decreased cognition;Decreased coordination;Decreased knowledge of use of DME;Decreased safety awareness       PT Treatment Interventions DME instruction;Gait training;Stair training;Functional mobility training;Therapeutic activities;Therapeutic exercise;Balance training;Neuromuscular re-education;Cognitive remediation;Patient/family education    PT Goals (Current goals can be found in the Care Plan section)  Acute Rehab PT Goals Patient  Stated Goal: unable to participate in goal setting PT Goal Formulation: Patient unable to participate in goal setting Time For Goal Achievement: 10/02/17 Potential to Achieve Goals: Good    Frequency 7X/week   Barriers to discharge Other (comment) Unsure of assist available at d/c.  Pt living alone PTA.     Co-evaluation               AM-PAC PT "6 Clicks" Daily Activity  Outcome Measure Difficulty turning over in bed (including adjusting bedclothes, sheets and blankets)?: None Difficulty moving from lying on back to sitting on the side of the bed? : A Little Difficulty sitting down on and standing up from a chair with arms (e.g., wheelchair, bedside commode, etc,.)?: Unable Help needed moving to and from a bed to chair (including a wheelchair)?: A Little Help needed walking in hospital room?: A Little Help needed climbing 3-5 steps with a railing? : A Little 6 Click Score: 17    End of Session Equipment Utilized During Treatment: Gait belt Activity Tolerance: Patient tolerated treatment well Patient left: in bed;with call bell/phone within reach;with nursing/sitter in room(sitter in room at all times) Nurse Communication: Mobility status PT Visit Diagnosis: Unsteadiness on feet (R26.81);Other abnormalities of gait and mobility (R26.89);Muscle weakness (generalized) (M62.81)    Time: [id number] PT Time Calculation (min) (ACUTE ONLY): 17 min  Charges:   PT Evaluation $PT Eval Low Complexity: 1 Low     PT G Codes:   PT G-Codes **NOT FOR INPATIENT CLASS** Functional Assessment Tool Used: AM-PAC 6 Clicks Basic Mobility;Clinical judgement Functional Limitation: Mobility: Walking and moving around Mobility: Walking and Moving Around Current Status ([id number]): At least 40 percent but less than 60 percent impaired, limited or restricted Mobility: Walking and Moving Around Goal Status [id number]([id number]): At least 20 percent but less than 40 percent impaired, limited or restricted     Encarnacion ChuAshley Abashian PT, DPT 09/18/2017, 11:15 AM

## 2017-09-18 NOTE — Clinical Social Work Note (Signed)
CSW received consult for patient. CSW is already following and is aware of PT recommendation for STR. CSW will speak with the family when able.  Argentina PonderKaren Martha Bowen Goyal, MSW, Theresia MajorsLCSWA [phone]

## 2017-09-18 NOTE — Progress Notes (Addendum)
POCT glucose - 57 with recheck of 65. Pt alert, talkative, denies co's, skin warm/dry; sitter remains at bedside. Asymptomatic.  Two orange juices given and will serve dinner now. Pt resistive to eating now; needing much encouragement. Pt unable to process need to eat with low blood sugar. Will continue to assess until resolved.

## 2017-09-18 NOTE — Consult Note (Addendum)
[facility] Consult   Reason for Consult:  Capacity to decide about discharge Referring Physician:  Dr. Leslye Peer Patient Identification: Megan Arnold MRN:  [id number] Principal Diagnosis: Mixed Alzheimer's and vascular dementia Diagnosis:   Patient Active Problem List   Diagnosis Date Noted  . Mixed Alzheimer's and vascular dementia [G30.9, F01.50, F02.80] 09/17/2017    Priority: High  . CVA (cerebrovascular accident) ([place]) [I63.9] 09/17/2017  . HTN (hypertension) [I10] 09/17/2017  . Diabetes ([place]) [E11.9] 09/17/2017  . CVA (cerebral vascular accident) ([employer]) [I63.9] 09/17/2017  . DIABETES MELLITUS, TYPE II [E11.9] 07/11/2006  . HYPERLIPIDEMIA [E78.5] 07/11/2006  . HYPERTENSION [I10] 07/11/2006  . VARICOSE VEINS, LOWER EXTREMITIES [I83.90] 07/11/2006  . VAGINITIS, ATROPHIC [N95.2] 07/11/2006  . OSTEOPENIA [M89.9, M94.9] 07/11/2006  . HX, PERSONAL, PAST NONCOMPLIANCE [Z91.19] 07/11/2006    Total Time spent with patient: 1 hour  Subjective:    Identifying data. Megan Arnold is a 83 year old female with a history of dementia.  Chief complaint. "I don't Arnold other people making decisions for me."  History of present illness. Information was obtained from the patient, chart, her daughter Megan Arnold, SW Claudine, her nurse and her sitter. The patient was brought to the hospital by her family for worsening of demntia and altered mental status. The patient has been living in a church apartment independently, next to her daughter who is disabled from stroke. Up until 2 weeks ago, the patient was able to manage cooking, cleaning, shopping at [place] with church people. Lately, she became confused, disoriented, agitated at night, falling and no longer able to care for herself. There is no family available to care for this patient. The patient understands that she has to "try hard" to convince [place] that she is fit to go back to her apartment but she is confused and forgetful, not  at all oriented to the situation and not fully aware of her difficulties.  Family believes and others agree that it is unsafe for the patient to continue to live independently.  Past psychiatric history. None except for dementia.  Family psychiatric history. None reported.  Social history. She has been active all her life. 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 She recalls swimming, skating, skiing. Her only daughter is now disabled herself unable to drive.  Risk to Self: Is patient at risk for suicide?: No Risk to Others:   Prior Inpatient Therapy:   Prior Outpatient Therapy:    Past Medical History:  Past Medical History:  Diagnosis Date  . Hypertension    History reviewed. No pertinent surgical history. Family History: History reviewed. No pertinent family history.  Social History:  Social History   Substance and Sexual Activity  Alcohol Use No     Social History   Substance and Sexual Activity  Drug Use Not on file    Social History   Socioeconomic History  . Marital status: Divorced    Spouse name: None  . Number of children: None  . Years of education: None  . Highest education level: None  Social Needs  . Financial resource strain: None  . Food insecurity - worry: None  . Food insecurity - inability: None  . Transportation needs - medical: None  . Transportation needs - non-medical: None  Occupational History  . None  Tobacco Use  . Smoking status: Never Smoker  . Smokeless tobacco: Never Used  Substance and Sexual Activity  . Alcohol use: No  . Drug use: None  . Sexual activity:  None  Other Topics Concern  . None  Social History Narrative  . None   Additional Social History:    Allergies:   Allergies  Allergen Reactions  . Penicillins     Has patient had a PCN reaction causing immediate rash, facial/tongue/throat swelling, SOB or lightheadedness with hypotension: Yes Has patient had a PCN reaction causing severe  rash involving mucus membranes or skin necrosis: Yes Has patient had a PCN reaction that required hospitalization: Yes Has patient had a PCN reaction occurring within the last 10 years: No If all of the above answers are "NO", then may proceed with Cephalosporin use.     Labs:  Results for orders placed or performed during the hospital encounter of 09/17/17 (from the past 48 hour(s))  Urine Drug Screen, Qualitative ([place] only)     Status: None   Collection Time: 09/17/17  1:10 PM  Result Value Ref Range   Tricyclic, Ur Screen NONE DETECTED NONE DETECTED   Amphetamines, Ur Screen NONE DETECTED NONE DETECTED   MDMA (Ecstasy)Ur Screen NONE DETECTED NONE DETECTED   Cocaine Metabolite,Ur [place] NONE DETECTED NONE DETECTED   Opiate, Ur Screen NONE DETECTED NONE DETECTED   Phencyclidine (PCP) Ur S NONE DETECTED NONE DETECTED   Cannabinoid 50 Ng, Ur [place] NONE DETECTED NONE DETECTED   Barbiturates, Ur Screen NONE DETECTED NONE DETECTED   Benzodiazepine, Ur Scrn NONE DETECTED NONE DETECTED   Methadone Scn, Ur NONE DETECTED NONE DETECTED    Comment: (NOTE) Tricyclics + metabolites, urine    Cutoff 1000 ng/mL Amphetamines + metabolites, urine  Cutoff 1000 ng/mL MDMA (Ecstasy), urine              Cutoff 500 ng/mL Cocaine Metabolite, urine          Cutoff 300 ng/mL Opiate + metabolites, urine        Cutoff 300 ng/mL Phencyclidine (PCP), urine         Cutoff 25 ng/mL Cannabinoid, urine                 Cutoff 50 ng/mL Barbiturates + metabolites, urine  Cutoff 200 ng/mL Benzodiazepine, urine              Cutoff 200 ng/mL Methadone, urine                   Cutoff 300 ng/mL The urine drug screen provides only a preliminary, unconfirmed analytical test result and should not be used for non-medical purposes. Clinical consideration and professional judgment should be applied to any positive drug screen result due to possible interfering substances. A more specific alternate chemical method must be used in  order to obtain a confirmed analytical result. Gas chromatography / mass spectrometry (GC/MS) is the preferred confirmat ory method. Performed at [facility], [address] [place], [place] [address]   Urinalysis, Complete w Microscopic     Status: Abnormal   Collection Time: 09/17/17  1:10 PM  Result Value Ref Range   Color, Urine AMBER (A) YELLOW    Comment: BIOCHEMICALS MAY BE AFFECTED BY COLOR   APPearance CLOUDY (A) CLEAR   Specific Gravity, Urine 1.023 1.005 - 1.030   pH 5.0 5.0 - 8.0   Glucose, UA NEGATIVE NEGATIVE mg/dL   Hgb urine dipstick NEGATIVE NEGATIVE   Bilirubin Urine NEGATIVE NEGATIVE   Ketones, ur 5 (A) NEGATIVE mg/dL   Protein, ur 30 (A) NEGATIVE mg/dL   Nitrite NEGATIVE NEGATIVE   Leukocytes, UA LARGE (A) NEGATIVE  RBC / HPF 6-30 0 - 5 RBC/hpf   WBC, UA TOO NUMEROUS TO COUNT 0 - 5 WBC/hpf   Bacteria, UA FEW (A) NONE SEEN   Squamous Epithelial / LPF 6-30 (A) NONE SEEN   Mucus PRESENT    Hyaline Casts, UA PRESENT    Ca Oxalate Crys, UA PRESENT     Comment: Performed at [facility], [address] [place], [place] [address]  Comprehensive metabolic panel     Status: Abnormal   Collection Time: 09/17/17  1:10 PM  Result Value Ref Range   Sodium 135 135 - 145 mmol/L   Potassium 3.2 (L) 3.5 - 5.1 mmol/L   Chloride 101 101 - 111 mmol/L   CO2 25 22 - 32 mmol/L   Glucose, Bld 190 (H) 65 - 99 mg/dL   BUN 21 (H) 6 - 20 mg/dL   Creatinine, Ser 1.09 (H) 0.44 - 1.00 mg/dL   Calcium 9.2 8.9 - 10.3 mg/dL   Total Protein 7.9 6.5 - 8.1 g/dL   Albumin 4.5 3.5 - 5.0 g/dL   AST 28 15 - 41 U/L   ALT 17 14 - 54 U/L   Alkaline Phosphatase 65 38 - 126 U/L   Total Bilirubin 1.1 0.3 - 1.2 mg/dL   GFR calc non Af Amer 42 (L) >60 mL/min   GFR calc Af Amer 49 (L) >60 mL/min    Comment: (NOTE) The eGFR has been calculated using the CKD EPI equation. This calculation has not been validated in all clinical situations. eGFR's persistently <60 mL/min  signify possible Chronic Kidney Disease.    Anion gap 9 5 - 15    Comment: Performed at [facility], [address] [place], [place] [address]  Ethanol     Status: None   Collection Time: 09/17/17  1:10 PM  Result Value Ref Range   Alcohol, Ethyl (B) <10 <10 mg/dL    Comment:        LOWEST DETECTABLE LIMIT FOR SERUM ALCOHOL IS 10 mg/dL FOR MEDICAL PURPOSES ONLY Performed at [facility], [address] [place], [place] [address]   CBC with Diff     Status: Abnormal   Collection Time: 09/17/17  1:10 PM  Result Value Ref Range   WBC 9.4 3.6 - 11.0 K/uL   RBC 4.96 3.80 - 5.20 MIL/uL   Hemoglobin 13.9 12.0 - 16.0 g/dL   HCT 41.5 35.0 - 47.0 %   MCV 83.7 80.0 - 100.0 fL   MCH 28.1 26.0 - 34.0 pg   MCHC 33.6 32.0 - 36.0 g/dL   RDW 13.7 11.5 - 14.5 %   Platelets 261 150 - 440 K/uL   Neutrophils Relative % 75 %   Neutro Abs 7.0 (H) 1.4 - 6.5 K/uL   Lymphocytes Relative 14 %   Lymphs Abs 1.3 1.0 - 3.6 K/uL   Monocytes Relative 8 %   Monocytes Absolute 0.8 0.2 - 0.9 K/uL   Eosinophils Relative 1 %   Eosinophils Absolute 0.1 0 - 0.7 K/uL   Basophils Relative 2 %   Basophils Absolute 0.2 (H) 0 - 0.1 K/uL    Comment: Performed at [facility], [address] [place], [place] [address]  Acetaminophen level     Status: Abnormal   Collection Time: 09/17/17  1:10 PM  Result Value Ref Range   Acetaminophen (Tylenol), Serum <10 (L) 10 - 30 ug/mL    Comment:        THERAPEUTIC CONCENTRATIONS  VARY SIGNIFICANTLY. A RANGE OF 10-30 ug/mL MAY BE AN EFFECTIVE CONCENTRATION FOR MANY PATIENTS. HOWEVER, SOME ARE BEST TREATED AT CONCENTRATIONS OUTSIDE THIS RANGE. ACETAMINOPHEN CONCENTRATIONS >150 ug/mL AT 4 HOURS AFTER INGESTION AND >50 ug/mL AT 12 HOURS AFTER INGESTION ARE OFTEN ASSOCIATED WITH TOXIC REACTIONS. Performed at [facility], [address] [place], [place] [address]   Salicylate level     Status: None   Collection Time: 09/17/17   1:10 PM  Result Value Ref Range   Salicylate Lvl <6.2 2.8 - 30.0 mg/dL    Comment: Performed at [facility], [address] [place], [place] [address]  Glucose, capillary     Status: Abnormal   Collection Time: 09/17/17  5:04 PM  Result Value Ref Range   Glucose-Capillary 237 (H) 65 - 99 mg/dL  Glucose, capillary     Status: Abnormal   Collection Time: 09/17/17 10:54 PM  Result Value Ref Range   Glucose-Capillary 138 (H) 65 - 99 mg/dL  Comprehensive metabolic panel     Status: Abnormal   Collection Time: 09/18/17  5:08 AM  Result Value Ref Range   Sodium 139 135 - 145 mmol/L   Potassium 3.2 (L) 3.5 - 5.1 mmol/L   Chloride 109 101 - 111 mmol/L   CO2 22 22 - 32 mmol/L   Glucose, Bld 156 (H) 65 - 99 mg/dL   BUN 19 6 - 20 mg/dL   Creatinine, Ser 1.00 0.44 - 1.00 mg/dL   Calcium 8.5 (L) 8.9 - 10.3 mg/dL   Total Protein 6.5 6.5 - 8.1 g/dL   Albumin 3.5 3.5 - 5.0 g/dL   AST 22 15 - 41 U/L   ALT 12 (L) 14 - 54 U/L   Alkaline Phosphatase 52 38 - 126 U/L   Total Bilirubin 1.0 0.3 - 1.2 mg/dL   GFR calc non Af Amer 47 (L) >60 mL/min   GFR calc Af Amer 54 (L) >60 mL/min    Comment: (NOTE) The eGFR has been calculated using the CKD EPI equation. This calculation has not been validated in all clinical situations. eGFR's persistently <60 mL/min signify possible Chronic Kidney Disease.    Anion gap 8 5 - 15    Comment: Performed at [facility], [address] [place], [place] [address]  CBC     Status: None   Collection Time: 09/18/17  5:08 AM  Result Value Ref Range   WBC 8.5 3.6 - 11.0 K/uL   RBC 4.44 3.80 - 5.20 MIL/uL   Hemoglobin 12.8 12.0 - 16.0 g/dL   HCT 37.2 35.0 - 47.0 %   MCV 83.7 80.0 - 100.0 fL   MCH 28.8 26.0 - 34.0 pg   MCHC 34.4 32.0 - 36.0 g/dL   RDW 13.5 11.5 - 14.5 %   Platelets 221 150 - 440 K/uL    Comment: Performed at [facility], [address] [place], [place] [address]  Glucose, capillary     Status: Abnormal    Collection Time: 09/18/17  7:48 AM  Result Value Ref Range   Glucose-Capillary 152 (H) 65 - 99 mg/dL  Glucose, capillary     Status: Abnormal   Collection Time: 09/18/17 12:04 PM  Result Value Ref Range   Glucose-Capillary 174 (H) 65 - 99 mg/dL    Current Facility-Administered Medications  Medication Dose Route Frequency Provider Last Rate Last Dose  . acetaminophen (TYLENOL) tablet 650 mg  650 mg Oral Q6H PRN Idelle Crouch, MD  Or  . acetaminophen (TYLENOL) suppository 650 mg  650 mg Rectal Q6H PRN Idelle Crouch, MD      . aspirin EC tablet 81 mg  81 mg Oral Daily Loletha Grayer, MD   81 mg at 09/18/17 1231  . atorvastatin (LIPITOR) tablet 40 mg  40 mg Oral q1800 Wieting, Richard, MD      . bisacodyl (DULCOLAX) suppository 10 mg  10 mg Rectal Daily PRN Idelle Crouch, MD      . ciprofloxacin (CIPRO) IVPB 400 mg  400 mg Intravenous Q24H Loletha Grayer, MD   Stopped at 09/18/17 [phone]  . clopidogrel (PLAVIX) tablet 75 mg  75 mg Oral Daily Idelle Crouch, MD   75 mg at 09/18/17 0810  . docusate sodium (COLACE) capsule 100 mg  100 mg Oral BID Idelle Crouch, MD   100 mg at 09/18/17 0809  . glimepiride (AMARYL) tablet 4 mg  4 mg Oral Q breakfast Idelle Crouch, MD   4 mg at 09/18/17 0809  . haloperidol lactate (HALDOL) injection 1 mg  1 mg Intravenous Q6H PRN Loletha Grayer, MD      . heparin injection 5,000 Units  5,000 Units Subcutaneous Q8H Idelle Crouch, MD   5,000 Units at 09/18/17 1350  . insulin aspart (novoLOG) injection 0-9 Units  0-9 Units Subcutaneous TID WC Idelle Crouch, MD   2 Units at 09/18/17 1230  . linagliptin (TRADJENTA) tablet 5 mg  5 mg Oral Daily Idelle Crouch, MD   5 mg at 09/18/17 0809  . [START ON 09/19/2017] lisinopril (PRINIVIL,ZESTRIL) tablet 10 mg  10 mg Oral Daily Wieting, Richard, MD      . memantine (NAMENDA XR) 24 hr capsule 28 mg  28 mg Oral Daily Idelle Crouch, MD   28 mg at 09/18/17 0809  . metoprolol succinate  (TOPROL-XL) 24 hr tablet 25 mg  25 mg Oral BID Idelle Crouch, MD   25 mg at 09/18/17 8242  . ondansetron (ZOFRAN) tablet 4 mg  4 mg Oral Q6H PRN Idelle Crouch, MD       Or  . ondansetron (ZOFRAN) injection 4 mg  4 mg Intravenous Q6H PRN Idelle Crouch, MD      . pantoprazole (PROTONIX) EC tablet 40 mg  40 mg Oral Daily Idelle Crouch, MD   40 mg at 09/18/17 0810  . QUEtiapine (SEROQUEL) tablet 25 mg  25 mg Oral QHS Wieting, Richard, MD      . sodium chloride flush (NS) 0.9 % injection 3 mL  3 mL Intravenous PRN Idelle Crouch, MD   3 mL at 09/18/17 1158    Musculoskeletal: Strength & Muscle Tone: decreased Gait & Station: unsteady Patient leans: N/A  Psychiatric Specialty Exam: Physical Exam  Nursing note and vitals reviewed. Psychiatric: She has a normal mood and affect. Her speech is normal and behavior is normal. Thought content normal. Cognition and memory are impaired. She expresses inappropriate judgment. She exhibits abnormal recent memory and abnormal remote memory.    Review of Systems  Musculoskeletal: Positive for falls.  Neurological: Positive for weakness.  Psychiatric/Behavioral: Positive for memory loss.  All other systems reviewed and are negative.   Blood pressure 130/62, pulse 88, temperature 98 F (36.7 C), temperature source Oral, resp. rate 18, height 5' 3" (1.6 m), weight 59.2 kg (130 lb 9.6 oz), SpO2 100 %.Body mass index is 23.13 kg/m.  General Appearance: Casual  Eye Contact:  Good  Speech:  Clear and Coherent  Volume:  Normal  Mood:  Anxious  Affect:  Appropriate  Thought Process:  Descriptions of Associations: Loose  Orientation:  Other:  person and place only  Thought Content:  Tangential  Suicidal Thoughts:  No  Homicidal Thoughts:  No  Memory:  Immediate;   Poor Recent;   Poor Remote;   Poor  Judgement:  Poor  Insight:  Lacking  Psychomotor Activity:  Decreased  Concentration:  Concentration: Poor and Attention Span: Poor   Recall:  Poor  Fund of Knowledge:  Fair  Language:  Fair  Akathisia:  No  Handed:  Right  AIMS (if indicated):     Assets:  Communication Skills Desire for Improvement Financial Resources/Insurance Resilience  ADL's:  Impaired  Cognition:  Impaired,  Severe  Sleep:        Treatment Plan Summary: Plan the patient does not have the capacity to decide about her disposition.  Disposition: No evidence of imminent risk to self or others at present.   Patient does not meet criteria for psychiatric inpatient admission. Supportive therapy provided about ongoing stressors. spoke with her daughter Megan Arnold extensively who supports placement  Orson Slick, MD 09/18/2017 4:49 PM

## 2017-09-18 NOTE — Plan of Care (Signed)
Continues to be confused;forgetful with rapid, excitable speech, flightly ideas, anxious with memory impairment. Continues to need Recruitment consultantsafety sitter for safety. Moods alternate between pleasant and irritable; pt responded to riding in transport chair around NS and back to bed x 1. No significant changes in neuro status; was able to correctly identify chair in picture [facility] evaluation. No family in so far today.

## 2017-09-18 NOTE — Progress Notes (Signed)
PHARMACY NOTE:  ANTIMICROBIAL RENAL DOSAGE ADJUSTMENT  Current antimicrobial regimen includes a mismatch between antimicrobial dosage and estimated renal function.  As per policy approved by the Pharmacy & Therapeutics and Medical Executive Committees, the antimicrobial dosage will be adjusted accordingly.  Current antimicrobial dosage:  Ciprofloxacin 400mg  IV Q12H  Indication: UTI  Renal Function:  Estimated Creatinine Clearance: 28.5 mL/min (by C-G formula based on SCr of 1 mg/dL). []      On intermittent HD, scheduled: []      On CRRT    Antimicrobial dosage has been changed to:  Ciprofloxacin 400mg  IV Q24H  Additional comments:   Thank you for allowing pharmacy to be a part of this patient's care.  Stormy CardKatsoudas,Susi Goslin K, [facility] 09/18/2017 7:24 AM

## 2017-09-18 NOTE — Progress Notes (Signed)
Patient ID: Megan Arnold, female   DOB: 06/23/1921, 83 y.o.   MRN: [id number]  [facility] PROGRESS NOTE  Megan Arnold WUJ:[id number] DOB: 10/23/1920 DOA: 09/17/2017 PCP: Yetta BarreSastry, Sangeeta (Inactive)  HPI/Subjective: Patient states she wants to leave the hospital as soon as possible.  Patient keeps on talking and does not give me time to talk.  States she feels okay.  States she her daughter brought her in.  Objective: Vitals:   09/18/17 1003 09/18/17 1406  BP: (!) 117/59 130/62  Pulse: 73 88  Resp: 18 18  Temp: 97.7 F (36.5 C) 98 F (36.7 C)  SpO2: 100% 100%    Filed Weights   09/17/17 1304 09/18/17 0553  Weight: 56.7 kg (125 lb) 59.2 kg (130 lb 9.6 oz)    ROS: Review of Systems  Unable to perform ROS: Mental acuity  Respiratory: Negative for shortness of breath.   Cardiovascular: Negative for chest pain.  Gastrointestinal: Negative for abdominal pain.   Exam: Physical Exam  HENT:  Nose: No mucosal edema.  Mouth/Throat: No oropharyngeal exudate or posterior oropharyngeal edema.  Eyes: Conjunctivae, EOM and lids are normal. Pupils are equal, round, and reactive to light.  Neck: No JVD present. Carotid bruit is not present. No edema present. No thyroid mass and no thyromegaly present.  Cardiovascular: S1 normal and S2 normal. An irregularly irregular rhythm present. Exam reveals no gallop.  No murmur heard. Pulses:      Dorsalis pedis pulses are 2+ on the right side, and 2+ on the left side.  Respiratory: No respiratory distress. She has no wheezes. She has no rhonchi. She has no rales.  GI: Soft. Bowel sounds are normal. There is no tenderness.  Musculoskeletal:       Right ankle: She exhibits no swelling.       Left ankle: She exhibits no swelling.  Lymphadenopathy:    She has no cervical adenopathy.  Neurological: She is alert. No cranial nerve deficit.  Power 5 out of 5 bilaterally  Skin: Skin is warm. No rash noted. Nails show no clubbing.   Psychiatric: Her speech is rapid and/or pressured.      Data Reviewed: Basic Metabolic Panel: Recent Labs  Lab 09/17/17 1310 09/18/17 0508  NA 135 139  K 3.2* 3.2*  CL 101 109  CO2 25 22  GLUCOSE 190* 156*  BUN 21* 19  CREATININE 1.09* 1.00  CALCIUM 9.2 8.5*   Liver Function Tests: Recent Labs  Lab 09/17/17 1310 09/18/17 0508  AST 28 22  ALT 17 12*  ALKPHOS 65 52  BILITOT 1.1 1.0  PROT 7.9 6.5  ALBUMIN 4.5 3.5   CBC: Recent Labs  Lab 09/17/17 1310 09/18/17 0508  WBC 9.4 8.5  NEUTROABS 7.0*  --   HGB 13.9 12.8  HCT 41.5 37.2  MCV 83.7 83.7  PLT 261 221    CBG: Recent Labs  Lab 09/17/17 1704 09/17/17 2254 09/18/17 0748 09/18/17 1204  GLUCAP 237* 138* 152* 174*     Studies: Dg Chest 2 View  Result Date: 09/17/2017 CLINICAL DATA:  Altered mental status. EXAM: CHEST  2 VIEW COMPARISON:  None. FINDINGS: The cardiomediastinal silhouette is borderline enlarged. Normal pulmonary vascularity. Atherosclerotic calcification of the aortic arch. Small nodular density over the right lower lung likely represents a nipple shadow. Slightly coarsened bibasilar interstitial markings. No focal consolidation, pleural effusion, or pneumothorax. No acute osseous abnormality. IMPRESSION: 1. Small nodular density over the right lower lung likely represents a nipple shadow. Consider  repeat x-rays with nipple markers. 2. Otherwise, no active cardiopulmonary disease. Electronically Signed   By: Obie DredgeWilliam T Derry M.D.   On: 09/17/2017 14:41   Ct Head Wo Contrast  Result Date: 09/17/2017 CLINICAL DATA:  Mental status changes EXAM: CT HEAD WITHOUT CONTRAST TECHNIQUE: Contiguous axial images were obtained from the base of the skull through the vertex without intravenous contrast. COMPARISON:  None. FINDINGS: Brain: There is extensive low density within the gray matter of the medial left occipital lobe as well as the medial left temporal lobe. There is slight mass effect upon the  occipital horn of the left lateral ventricle. There is no evidence of hemorrhage. There are chronic ischemic changes in the periventricular white matter. Old infarct in the left thalamus is present. Mild global atrophy appropriate to age. Vascular: No hyperdense vessel or unexpected calcification. Skull: Cranium is intact. Sinuses/Orbits: No acute finding. Other: None. IMPRESSION: There is an infarct involving the medial left temporal and occipital lobes. Subacute age is favored. Exact age is indeterminate based on CT imaging. There is no evidence of hemorrhage. Chronic ischemic changes and atrophy are superimposed. Electronically Signed   By: Jolaine ClickArthur  Hoss M.D.   On: 09/17/2017 14:27   [place] Carotid Bilateral  Result Date: 09/17/2017 CLINICAL DATA:  Cerebrovascular accident EXAM: BILATERAL CAROTID DUPLEX ULTRASOUND TECHNIQUE: Wallace CullensGray scale imaging, color Doppler and duplex ultrasound were performed of bilateral carotid and vertebral arteries in the neck. COMPARISON:  None. FINDINGS: Criteria: Quantification of carotid stenosis is based on velocity parameters that correlate the residual internal carotid diameter with NASCET-based stenosis levels, using the diameter of the distal internal carotid lumen as the denominator for stenosis measurement. The following velocity measurements were obtained: RIGHT ICA:  50/17 cm/sec CCA:  80/6 cm/sec SYSTOLIC ICA/CCA RATIO:  0.63 DIASTOLIC ICA/CCA RATIO:  2.8 ECA:  88 cm/sec LEFT ICA:  43/11 cm/sec CCA:  104/12 cm/sec SYSTOLIC ICA/CCA RATIO:  0.41 DIASTOLIC ICA/CCA RATIO:  0.91 ECA:  85 cm/sec RIGHT CAROTID ARTERY: Preliminary grayscale images demonstrate some intimal thickening within the common carotid artery. No significant atherosclerotic plaque is noted. Waveforms, velocities and flow velocity ratios show no evidence of focal hemodynamically significant stenosis. RIGHT VERTEBRAL ARTERY:  Antegrade in nature LEFT CAROTID ARTERY: Preliminary grayscale images demonstrate mild  intimal thickening within the common carotid artery. Minimal plaque is noted in the region of the carotid bulb. The waveforms, velocities and flow velocity ratios show no evidence of focal hemodynamically significant stenosis. LEFT VERTEBRAL ARTERY:  Antegrade in nature. IMPRESSION: Mild atherosclerotic changes without focal hemodynamically significant stenosis. Electronically Signed   By: Alcide CleverMark  Lukens M.D.   On: 09/17/2017 16:50    Scheduled Meds: . aspirin EC  81 mg Oral Daily  . clopidogrel  75 mg Oral Daily  . docusate sodium  100 mg Oral BID  . glimepiride  4 mg Oral Q breakfast  . heparin  5,000 Units Subcutaneous Q8H  . insulin aspart  0-9 Units Subcutaneous TID WC  . linagliptin  5 mg Oral Daily  . lisinopril  20 mg Oral Daily  . memantine  28 mg Oral Daily  . metoprolol succinate  25 mg Oral BID  . pantoprazole  40 mg Oral Daily  . potassium chloride  40 mEq Oral Once  . pravastatin  40 mg Oral Daily  . QUEtiapine  25 mg Oral QHS   Continuous Infusions: . ciprofloxacin Stopped (09/18/17 [id number])    Assessment/Plan:  1. Acute to subacute stroke involving the medial left temporal and occipital  lobes.  I do not think I will be able to get the patient into an MRI machine at this time.  Carotid ultrasound unremarkable.  Still awaiting echocardiogram.  The patient does have atrial fibrillation but I am hesitant on full anticoagulation because of the patient's mental status.  Patient on aspirin and Plavix for right now.  Will get neurology evaluation.  Physical therapy recommended rehab 2. Dementia with behavioral disturbance.  Check an RPR, B12 and TSH.  We will get psychiatric consultation for competence.  Patient states that she is not staying in the hospital past tomorrow.  Start Seroquel at night 3. Atrial fibrillation on metoprolol.  Hesitant on full anticoagulation with her dementia 4. Essential hypertension on metoprolol.  I will lower the dose of lisinopril to 10 mg daily 5. GERD  on Protonix 6. Hyperlipidemia unspecified.  Change pravastatin to Lipitor 7. Type 2 diabetes mellitus.  Check a hemoglobin A1c.  Patient on sliding scale and oral medication 8. Acute cystitis on Cipro follow-up urine culture 9. Hypokalemia replace potassium orally  Code Status:     Code Status Orders  (From admission, onward)        Start     Ordered   09/17/17 1615  Full code  Continuous     09/17/17 1614    Code Status History    Date Active Date Inactive Code Status Order ID Comments User Context   This patient has a current code status but no historical code status.     Family Communication: Spoke with daughter on phone Disposition Plan: We will get psych consultation for competence.  Daughter is interested in assisted living.  Physical therapy recommended rehab.  Patient wants out of the hospital as soon as possible to go home.  Consultants:  Cardiology  Neurology  Psychiatry  Time spent: 32 minutes in coordination of care  [employer]

## 2017-09-18 NOTE — BH Assessment (Signed)
Writer informed psychiatrist (Dr. Pucilowska) of consult. 

## 2017-09-18 NOTE — Progress Notes (Signed)
SUBJECTIVE: Patient is feeling much better this morning afternoon patient is feeling much better this morning   Vitals:   09/18/17 0226 09/18/17 0553 09/18/17 0806 09/18/17 1003  BP: 138/78 (!) 159/85 (!) 158/87 (!) 117/59  Pulse: 81 70 78 73  Resp: 19 20 20 18   Temp: 98 F (36.7 C) 98.3 F (36.8 C) 97.9 F (36.6 C) 97.7 F (36.5 C)  TempSrc:  Oral Oral Oral  SpO2: 100% 100% 100% 100%  Weight:  130 lb 9.6 oz (59.2 kg)    Height:        Intake/Output Summary (Last 24 hours) at 09/18/2017 1018 Last data filed at 09/18/2017 [id number] Gross per 24 hour  Intake 2112.5 ml  Output -  Net 2112.5 ml    LABS: Basic Metabolic Panel: Recent Labs    09/17/17 1310 09/18/17 0508  NA 135 139  K 3.2* 3.2*  CL 101 109  CO2 25 22  GLUCOSE 190* 156*  BUN 21* 19  CREATININE 1.09* 1.00  CALCIUM 9.2 8.5*   Liver Function Tests: Recent Labs    09/17/17 1310 09/18/17 0508  AST 28 22  ALT 17 12*  ALKPHOS 65 52  BILITOT 1.1 1.0  PROT 7.9 6.5  ALBUMIN 4.5 3.5   No results for input(s): LIPASE, AMYLASE in the last 72 hours. CBC: Recent Labs    09/17/17 1310 09/18/17 0508  WBC 9.4 8.5  NEUTROABS 7.0*  --   HGB 13.9 12.8  HCT 41.5 37.2  MCV 83.7 83.7  PLT 261 221   Cardiac Enzymes: No results for input(s): CKTOTAL, CKMB, CKMBINDEX, TROPONINI in the last 72 hours. BNP: Invalid input(s): POCBNP D-Dimer: No results for input(s): DDIMER in the last 72 hours. Hemoglobin A1C: No results for input(s): HGBA1C in the last 72 hours. Fasting Lipid Panel: No results for input(s): CHOL, HDL, LDLCALC, TRIG, CHOLHDL, LDLDIRECT in the last 72 hours. Thyroid Function Tests: No results for input(s): TSH, T4TOTAL, T3FREE, THYROIDAB in the last 72 hours.  Invalid input(s): FREET3 Anemia Panel: No results for input(s): VITAMINB12, FOLATE, FERRITIN, TIBC, IRON, RETICCTPCT in the last 72 hours.   PHYSICAL EXAM General: Well developed, well nourished, in no acute distress HEENT:   Normocephalic and atramatic Neck:  No JVD.  Lungs: Clear bilaterally to auscultation and percussion. Heart: HRRR . Normal S1 and S2 without gallops or murmurs.  Abdomen: Bowel sounds are positive, abdomen soft and non-tender  Msk:  Back normal, normal gait. Normal strength and tone for age. Extremities: No clubbing, cyanosis or edema.   Neuro: Alert and oriented X 3. Psych:  Good affect, responds appropriately  TELEMETRY: Atrial fibrillation with controlled ventricular rate about 80/m ST atrial fibrillation with controlled ventricular rate about 80/min  ASSESSMENT AND PLAN:  Atrial fibrillation with controlled ventricular rate about 80/min  Principal Problem:   CVA (cerebrovascular accident) (HCC) Active Problems:   Mixed Alzheimer's and vascular dementia   HTN (hypertension)   Diabetes (HCC)   CVA (cerebral vascular accident) (HCC)    Baylee Campus A, MD, [facility] CentersFACC 09/18/2017 10:18 AM    2

## 2017-09-19 ENCOUNTER — Inpatient Hospital Stay
Admit: 2017-09-19 | Discharge: 2017-09-19 | Disposition: A | Discharge status: Home / Self Care | Payer: Medicare Other | Attending: Cardiovascular Disease | Admitting: Cardiovascular Disease

## 2017-09-19 ENCOUNTER — Inpatient Hospital Stay: Payer: Medicare Other

## 2017-09-19 DIAGNOSIS — I639 Cerebral infarction, unspecified: Secondary | ICD-10-CM

## 2017-09-19 LAB — GLUCOSE, CAPILLARY
Glucose-Capillary: 107 mg/dL — ABNORMAL HIGH (ref 65–99)
Glucose-Capillary: 229 mg/dL — ABNORMAL HIGH (ref 65–99)
Glucose-Capillary: 103 mg/dL — ABNORMAL HIGH (ref 65–99)
Glucose-Capillary: 171 mg/dL — ABNORMAL HIGH (ref 65–99)

## 2017-09-19 LAB — TSH: TSH: 5.533 u[IU]/mL — ABNORMAL HIGH (ref 0.350–4.500)

## 2017-09-19 LAB — BASIC METABOLIC PANEL
ANION GAP: 6 (ref 5–15)
BUN: 17 mg/dL (ref 6–20)
CHLORIDE: 111 mmol/L (ref 101–111)
CO2: 25 mmol/L (ref 22–32)
Calcium: 8.7 mg/dL — ABNORMAL LOW (ref 8.9–10.3)
Creatinine, Ser: 1.17 mg/dL — ABNORMAL HIGH (ref 0.44–1.00)
GFR calc non Af Amer: 39 mL/min — ABNORMAL LOW (ref >60)
GFR, EST AFRICAN AMERICAN: 45 mL/min — AB (ref >60)
Glucose, Bld: 105 mg/dL — ABNORMAL HIGH (ref 65–99)
Potassium: 3.8 mmol/L (ref 3.5–5.1)
Sodium: 142 mmol/L (ref 135–145)

## 2017-09-19 LAB — HEMOGLOBIN A1C
Hgb A1c MFr Bld: 6.7 % — ABNORMAL HIGH (ref 4.8–5.6)
Mean Plasma Glucose: 145.59 mg/dL

## 2017-09-19 LAB — LIPID PANEL
Cholesterol: 135 mg/dL (ref 0–200)
HDL: 49 mg/dL (ref >40)
LDL CALC: 62 mg/dL (ref 0–99)
TRIGLYCERIDES: 119 mg/dL (ref <150)
Total CHOL/HDL Ratio: 2.8 RATIO
VLDL: 24 mg/dL (ref 0–40)

## 2017-09-19 LAB — VITAMIN B12: Vitamin B-12: 391 pg/mL (ref 180–914)

## 2017-09-19 MED ORDER — LORAZEPAM 2 MG/ML IJ SOLN: 0.5000 mg | Freq: Once | INTRAMUSCULAR | Status: DC

## 2017-09-19 MED ORDER — GLIMEPIRIDE 2 MG PO TABS
2.0000 mg | ORAL_TABLET | Freq: Every day | ORAL | Status: DC
  Administered 2017-09-19 – 2017-09-20 (×2): 2 mg via ORAL
  Filled 2017-09-19 (×2): qty 1

## 2017-09-19 MED ORDER — CIPROFLOXACIN HCL 500 MG PO TABS
500.0000 mg | ORAL_TABLET | Freq: Every day | ORAL | Status: DC
  Administered 2017-09-20: 500 mg via ORAL
  Filled 2017-09-19: qty 1

## 2017-09-19 NOTE — Progress Notes (Signed)
*  PRELIMINARY RESULTS* Echocardiogram 2D Echocardiogram has been performed.  Cristela BlueHege, Seila Liston 09/19/2017, 3:51 PM

## 2017-09-19 NOTE — Clinical Social Work Note (Signed)
CSW met with patient and family they have chosen to go to [facility].  CSW contacted [facility] and they can accept patient once she is medically ready for discharge and orders have been received.  Jones Broom. Norval Morton, MSW, [place]  09/19/2017 5:39 PM

## 2017-09-19 NOTE — Consult Note (Signed)
Referring Physician: Renae Gloss    Chief Complaint: Confusion  HPI: Megan Arnold is an 83 y.o. female with a history of dementia previously living alone.  Due to dementia patient unable to provide any useful history.  Family not available at this time therefore all history obtained from the chart.  Patient was becoming gradually more demented over time but over the last 5 days she had become acutely confused over baseline to the point where they did not think she could safely be at home.  She did fall apparently about a week prior to admission with no obvious injury.  Initial NIHSS of 2.    Date last known well: Unable to determine Time last known well: Unable to determine tPA Given: No: Unable to determine LKW  Past Medical History:  Diagnosis Date  . Hypertension   HLD, dementia, DM  History reviewed. No pertinent surgical history.  Family history: Daughter with a stroke.  Parents deceased.   Social History:  reports that  has never smoked. she has never used smokeless tobacco. She reports that she does not drink alcohol. Her drug history is not on file.  Allergies:  Allergies  Allergen Reactions  . Penicillins     Has patient had a PCN reaction causing immediate rash, facial/tongue/throat swelling, SOB or lightheadedness with hypotension: Yes Has patient had a PCN reaction causing severe rash involving mucus membranes or skin necrosis: Yes Has patient had a PCN reaction that required hospitalization: Yes Has patient had a PCN reaction occurring within the last 10 years: No If all of the above answers are "NO", then may proceed with Cephalosporin use.     Medications:  I have reviewed the patient's current medications. Prior to Admission:  Medications Prior to Admission  Medication Sig Dispense Refill Last Dose  . aspirin EC 81 MG tablet Take 81 mg by mouth daily.   N/A at N/A  . bisacodyl (DULCOLAX) 5 MG EC tablet Take 10 mg by mouth daily as needed for moderate  constipation.   N/A at N/A  . Cyanocobalamin (VITAMIN B-12) 5000 MCG TBDP Take 5,000 Units by mouth daily.   N/A at N/A  . glimepiride (AMARYL) 4 MG tablet Take 4 mg by mouth daily.   N/A at N/A  . JANUVIA 100 MG tablet Take 100 mg by mouth daily.    N/A at N/A  . lisinopril (PRINIVIL,ZESTRIL) 20 MG tablet Take 20 mg by mouth daily.   N/A at N/A  . NAMENDA XR 28 MG CP24 24 hr capsule Take 28 mg by mouth daily.   N/A at N/A  . pravastatin (PRAVACHOL) 40 MG tablet Take 40 mg by mouth daily.   N/A at N/A  . Vitamins-Lipotropics (LIPOFLAVONOID) TABS Take by mouth as directed.   N/A at N/A  . metoprolol succinate (TOPROL-XL) 25 MG 24 hr tablet Take 25 mg by mouth 2 (two) times daily.    N/A at N/A   Scheduled: . aspirin EC  81 mg Oral Daily  . atorvastatin  40 mg Oral q1800  . clopidogrel  75 mg Oral Daily  . docusate sodium  100 mg Oral BID  . glimepiride  2 mg Oral Q breakfast  . heparin  5,000 Units Subcutaneous Q8H  . insulin aspart  0-9 Units Subcutaneous TID WC  . linagliptin  5 mg Oral Daily  . lisinopril  10 mg Oral Daily  . memantine  28 mg Oral Daily  . metoprolol succinate  25 mg Oral BID  .  pantoprazole  40 mg Oral Daily  . QUEtiapine  25 mg Oral QHS    ROS: History obtained from the patient  General ROS: negative for - chills, fatigue, fever, night sweats, weight gain or weight loss Psychological ROS: negative for - behavioral disorder, hallucinations, memory difficulties, mood swings or suicidal ideation Ophthalmic ROS: negative for - blurry vision, double vision, eye pain or loss of vision ENT ROS: negative for - epistaxis, nasal discharge, oral lesions, sore throat, tinnitus or vertigo Allergy and Immunology ROS: negative for - hives or itchy/watery eyes Hematological and Lymphatic ROS: bruising on legs bilaterally Endocrine ROS: negative for - galactorrhea, hair pattern changes, polydipsia/polyuria or temperature intolerance Respiratory ROS: negative for - cough,  hemoptysis, shortness of breath or wheezing Cardiovascular ROS: negative for - chest pain, dyspnea on exertion, edema or irregular heartbeat Gastrointestinal ROS: negative for - abdominal pain, diarrhea, hematemesis, nausea/vomiting or stool incontinence Genito-Urinary ROS: negative for - dysuria, hematuria, incontinence or urinary frequency/urgency Musculoskeletal ROS: leg pain Neurological ROS: as noted in HPI Dermatological ROS: negative for rash and skin lesion changes  Physical Examination: Blood pressure (!) 151/87, pulse 73, temperature 97.6 F (36.4 C), temperature source Oral, resp. rate 18, height 5\' 3"  (1.6 m), weight 58.9 kg (129 lb 14.4 oz), SpO2 100 %.  HEENT-  Normocephalic, no lesions, without obvious abnormality.  Normal external eye and conjunctiva.  Normal TM's bilaterally.  Normal auditory canals and external ears. Normal external nose, mucus membranes and septum.  Normal pharynx. Cardiovascular- S1, S2 normal, pulses palpable throughout   Lungs- chest clear, no wheezing, rales, normal symmetric air entry Abdomen- soft, non-tender; bowel sounds normal; no masses,  no organomegaly Extremities- no edema Lymph-no adenopathy palpable Musculoskeletal-no joint tenderness, deformity or swelling Skin-bruising on lower legs bilaterally  Neurological Examination   Mental Status: Alert, unable to tell me the current year or the year that she was born.  Speech fluent but rambling and tangential with occasional paraphasic errors.  Able to follow 3 step commands without difficulty. Cranial Nerves: II: Discs flat bilaterally; Visual field cut, most significant superiorly, pupils equal, round, reactive to light and accommodation III,IV, VI: ptosis not present, extra-ocular motions intact bilaterally V,VII: decrease in right NLF, facial light touch sensation normal bilaterally VIII: hearing normal bilaterally IX,X: gag reflex present XI: bilateral shoulder shrug XII: midline tongue  extension Motor: Right : Upper extremity   5-/5    Left:     Upper extremity   5/5  Lower extremity   5/5     Lower extremity   5/5 Tone and bulk:normal tone throughout; no atrophy noted Sensory: Pinprick and light touch intact throughout, bilaterally Deep Tendon Reflexes: 2+ in the upper extremities and absent in the lower extremities Plantars: Right: upgoing   Left: upgoing Cerebellar: Normal finger-to-nose and normal heel-to-shin testing bilaterally Gait: not tested due to safety concerns   Laboratory Studies:  Basic Metabolic Panel: Recent Labs  Lab 09/17/17 1310 09/18/17 0508 09/19/17 0409  NA 135 139 142  K 3.2* 3.2* 3.8  CL 101 109 111  CO2 25 22 25   GLUCOSE 190* 156* 105*  BUN 21* 19 17  CREATININE 1.09* 1.00 1.17*  CALCIUM 9.2 8.5* 8.7*    Liver Function Tests: Recent Labs  Lab 09/17/17 1310 09/18/17 0508  AST 28 22  ALT 17 12*  ALKPHOS 65 52  BILITOT 1.1 1.0  PROT 7.9 6.5  ALBUMIN 4.5 3.5   No results for input(s): LIPASE, AMYLASE in the last 168 hours.  No results for input(s): AMMONIA in the last 168 hours.  CBC: Recent Labs  Lab 09/17/17 1310 09/18/17 0508  WBC 9.4 8.5  NEUTROABS 7.0*  --   HGB 13.9 12.8  HCT 41.5 37.2  MCV 83.7 83.7  PLT 261 221    Cardiac Enzymes: No results for input(s): CKTOTAL, CKMB, CKMBINDEX, TROPONINI in the last 168 hours.  BNP: Invalid input(s): POCBNP  CBG: Recent Labs  Lab 09/18/17 1708 09/18/17 1711 09/18/17 1803 09/18/17 2147 09/19/17 0731  GLUCAP 57* 65 116* 140* 107*    Microbiology: No results found for this or any previous visit.  Coagulation Studies: No results for input(s): LABPROT, INR in the last 72 hours.  Urinalysis:  Recent Labs  Lab 09/17/17 1310  COLORURINE AMBER*  LABSPEC 1.023  PHURINE 5.0  GLUCOSEU NEGATIVE  HGBUR NEGATIVE  BILIRUBINUR NEGATIVE  KETONESUR 5*  PROTEINUR 30*  NITRITE NEGATIVE  LEUKOCYTESUR LARGE*    Lipid Panel:    Component Value Date/Time    CHOL 135 09/19/2017 0409   TRIG 119 09/19/2017 0409   HDL 49 09/19/2017 0409   CHOLHDL 2.8 09/19/2017 0409   VLDL 24 09/19/2017 0409   LDLCALC 62 09/19/2017 0409    HgbA1C:  Lab Results  Component Value Date   HGBA1C 6.7 (H) 09/18/2017    Urine Drug Screen:      Component Value Date/Time   LABOPIA NONE DETECTED 09/17/2017 1310   COCAINSCRNUR NONE DETECTED 09/17/2017 1310   LABBENZ NONE DETECTED 09/17/2017 1310   AMPHETMU NONE DETECTED 09/17/2017 1310   THCU NONE DETECTED 09/17/2017 1310   LABBARB NONE DETECTED 09/17/2017 1310    Alcohol Level:  Recent Labs  Lab 09/17/17 1310  ETH <10    Other results: EKG: atrial fibrillation, rate 88 bpm.  Imaging: Dg Chest 2 View  Result Date: 09/17/2017 CLINICAL DATA:  Altered mental status. EXAM: CHEST  2 VIEW COMPARISON:  None. FINDINGS: The cardiomediastinal silhouette is borderline enlarged. Normal pulmonary vascularity. Atherosclerotic calcification of the aortic arch. Small nodular density over the right lower lung likely represents a nipple shadow. Slightly coarsened bibasilar interstitial markings. No focal consolidation, pleural effusion, or pneumothorax. No acute osseous abnormality. IMPRESSION: 1. Small nodular density over the right lower lung likely represents a nipple shadow. Consider repeat x-rays with nipple markers. 2. Otherwise, no active cardiopulmonary disease. Electronically Signed   By: Obie DredgeWilliam T Derry M.D.   On: 09/17/2017 14:41   Ct Head Wo Contrast  Result Date: 09/17/2017 CLINICAL DATA:  Mental status changes EXAM: CT HEAD WITHOUT CONTRAST TECHNIQUE: Contiguous axial images were obtained from the base of the skull through the vertex without intravenous contrast. COMPARISON:  None. FINDINGS: Brain: There is extensive low density within the gray matter of the medial left occipital lobe as well as the medial left temporal lobe. There is slight mass effect upon the occipital horn of the left lateral ventricle. There  is no evidence of hemorrhage. There are chronic ischemic changes in the periventricular white matter. Old infarct in the left thalamus is present. Mild global atrophy appropriate to age. Vascular: No hyperdense vessel or unexpected calcification. Skull: Cranium is intact. Sinuses/Orbits: No acute finding. Other: None. IMPRESSION: There is an infarct involving the medial left temporal and occipital lobes. Subacute age is favored. Exact age is indeterminate based on CT imaging. There is no evidence of hemorrhage. Chronic ischemic changes and atrophy are superimposed. Electronically Signed   By: Jolaine ClickArthur  Hoss M.D.   On: 09/17/2017 14:27   Mr Brain  Wo Contrast  Result Date: 09/19/2017 CLINICAL DATA:  Worsening dementia. EXAM: MRI HEAD WITHOUT CONTRAST TECHNIQUE: Multiplanar, multiecho pulse sequences of the brain and surrounding structures were obtained without intravenous contrast. COMPARISON:  CT 09/17/2017 FINDINGS: Brain: Subacute infarction in the left PCA territory affecting the posteromedial temporal lobe, occipital lobe, left thalamus and left corpus callosum and radiating white matter tracts. No other acute infarction. Brainstem shows minimal small vessel change in the pons. No cerebellar insult. Cerebral hemispheres otherwise show mild age related volume loss with chronic small-vessel ischemic changes of the white matter. No mass lesion, hemorrhage, hydrocephalus or extra-axial collection. Vascular: Major vessels at the base of the brain show flow. Skull and upper cervical spine: Negative Sinuses/Orbits: Clear/normal Other: None IMPRESSION: Subacute infarction in the left PCA territory. Mild swelling but no hemorrhage or mass effect. Background pattern of mild age related volume loss and small-vessel ischemic changes of the white matter. Electronically Signed   By: Paulina Fusi M.D.   On: 09/19/2017 10:26   US Carotid Bilateral  Result Date: 09/17/2017 CLINICAL DATA:  Cerebrovascular accident EXAM:  BILATERAL CAROTID DUPLEX ULTRASOUND TECHNIQUE: Wallace Cullens scale imaging, color Doppler and duplex ultrasound were performed of bilateral carotid and vertebral arteries in the neck. COMPARISON:  None. FINDINGS: Criteria: Quantification of carotid stenosis is based on velocity parameters that correlate the residual internal carotid diameter with NASCET-based stenosis levels, using the diameter of the distal internal carotid lumen as the denominator for stenosis measurement. The following velocity measurements were obtained: RIGHT ICA:  50/17 cm/sec CCA:  80/6 cm/sec SYSTOLIC ICA/CCA RATIO:  0.63 DIASTOLIC ICA/CCA RATIO:  2.8 ECA:  88 cm/sec LEFT ICA:  43/11 cm/sec CCA:  104/12 cm/sec SYSTOLIC ICA/CCA RATIO:  0.41 DIASTOLIC ICA/CCA RATIO:  0.91 ECA:  85 cm/sec RIGHT CAROTID ARTERY: Preliminary grayscale images demonstrate some intimal thickening within the common carotid artery. No significant atherosclerotic plaque is noted. Waveforms, velocities and flow velocity ratios show no evidence of focal hemodynamically significant stenosis. RIGHT VERTEBRAL ARTERY:  Antegrade in nature LEFT CAROTID ARTERY: Preliminary grayscale images demonstrate mild intimal thickening within the common carotid artery. Minimal plaque is noted in the region of the carotid bulb. The waveforms, velocities and flow velocity ratios show no evidence of focal hemodynamically significant stenosis. LEFT VERTEBRAL ARTERY:  Antegrade in nature. IMPRESSION: Mild atherosclerotic changes without focal hemodynamically significant stenosis. Electronically Signed   By: Alcide Clever M.D.   On: 09/17/2017 16:50    Assessment: 83 y.o. female presenting with confusion.  Neurological examination reveals some mild right sided weakness and right visual field loss.  MRI of the brain reviewed and reveals a subacute left PCA infarct.  Etiology likely embolic with patient being in atrial fibrillation at presentation.  Carotid dopplers show no evidence of hemodynamically  significant stenosis.  Echocardiogram pending.  A1c 6.7, LDL 62.  Patient on ASA prior to admission.    Stroke Risk Factors - atrial fibrillation, diabetes mellitus, hyperlipidemia and hypertension  Plan: 1. PT consult, OT consult, Speech consult 2. Echocardiogram pending 3. Prophylactic therapy-Eliquis.  Patient on a statin. 4. NPO until RN stroke swallow screen 5. Telemetry monitoring 6. Frequent neuro checks   Thana Farr, MD Neurology [phone] 09/19/2017, 11:10 AM

## 2017-09-19 NOTE — Progress Notes (Signed)
SUBJECTIVE: Confused   Vitals:   09/19/17 0030 09/19/17 0425 09/19/17 0500 09/19/17 0736  BP: 136/85 (!) 150/76  (!) 151/87  Pulse: 76 81  73  Resp: 18 19  18   Temp: 97.8 F (36.6 C) 98 F (36.7 C)  97.6 F (36.4 C)  TempSrc: Oral Oral  Oral  SpO2: 100% 100%    Weight:   129 lb 14.4 oz (58.9 kg)   Height:        Intake/Output Summary (Last 24 hours) at 09/19/2017 0757 Last data filed at 09/18/2017 1801 Gross per 24 hour  Intake 2057 ml  Output 300 ml  Net 1757 ml    LABS: Basic Metabolic Panel: Recent Labs    09/18/17 0508 09/19/17 0409  NA 139 142  K 3.2* 3.8  CL 109 111  CO2 22 25  GLUCOSE 156* 105*  BUN 19 17  CREATININE 1.00 1.17*  CALCIUM 8.5* 8.7*   Liver Function Tests: Recent Labs    09/17/17 1310 09/18/17 0508  AST 28 22  ALT 17 12*  ALKPHOS 65 52  BILITOT 1.1 1.0  PROT 7.9 6.5  ALBUMIN 4.5 3.5   No results for input(s): LIPASE, AMYLASE in the last 72 hours. CBC: Recent Labs    09/17/17 1310 09/18/17 0508  WBC 9.4 8.5  NEUTROABS 7.0*  --   HGB 13.9 12.8  HCT 41.5 37.2  MCV 83.7 83.7  PLT 261 221   Cardiac Enzymes: No results for input(s): CKTOTAL, CKMB, CKMBINDEX, TROPONINI in the last 72 hours. BNP: Invalid input(s): POCBNP D-Dimer: No results for input(s): DDIMER in the last 72 hours. Hemoglobin A1C: Recent Labs    09/18/17 0508  HGBA1C 6.7*   Fasting Lipid Panel: Recent Labs    09/19/17 0409  CHOL 135  HDL 49  LDLCALC 62  TRIG 119  CHOLHDL 2.8   Thyroid Function Tests: Recent Labs    09/19/17 0409  TSH 5.533*   Anemia Panel: No results for input(s): VITAMINB12, FOLATE, FERRITIN, TIBC, IRON, RETICCTPCT in the last 72 hours.   PHYSICAL EXAM General: Well developed, well nourished, in no acute distress HEENT:  Normocephalic and atramatic Neck:  No JVD.  Lungs: Clear bilaterally to auscultation and percussion. Heart: HRRR . Normal S1 and S2 without gallops or murmurs.  Abdomen: Bowel sounds are positive,  abdomen soft and non-tender  Msk:  Back normal, normal gait. Normal strength and tone for age. Extremities: No clubbing, cyanosis or edema.   Neuro: Alert and oriented X 3. Psych:  Good affect, responds appropriately  TELEMETRY: Afib with controlled rate  ASSESSMENT AND PLAN: Atrial fibrillation with controlled rate.  Principal Problem:   Mixed Alzheimer's and vascular dementia Active Problems:   CVA (cerebrovascular accident) (HCC)   HTN (hypertension)   Diabetes (HCC)   CVA (cerebral vascular accident) (HCC)    Megan BlackwaterKHAN,Maris Abascal A, MD, [facility] 09/19/2017 7:57 AM

## 2017-09-19 NOTE — Progress Notes (Signed)
Physical Therapy Treatment Patient Details Name: Megan DakinMaxine H Arnold MRN: [id number] DOB: 09/07/1921 Today's Date: 09/19/2017    History of Present Illness Pt is a 83 y/o F who presented with worsening confusion (pt with dementia at baseline).  CT head showed new/acute CVA.  Pt's PMH includes mixed Alzheimer's and vascular dementia.     PT Comments    [name] was pleasant and agreeable to work with PT.  She continues to require up to min assist for transfers and safe ambulation due to unsteadiness.  Pt tolerated therapeutic exercises which were focused on strengthening BLEs as well as addressing balance deficits.  SNF remains most appropriate d/c plan at this time.     Follow Up Recommendations  SNF     Equipment Recommendations  None recommended by PT    Recommendations for Other Services       Precautions / Restrictions Precautions Precautions: Fall Restrictions Weight Bearing Restrictions: No    Mobility  Bed Mobility Overal bed mobility: Needs Assistance Bed Mobility: Supine to Sit;Sit to Supine     Supine to sit: Min guard Sit to supine: Min guard   General bed mobility comments: Verbal cues for sequencing.  Close min gaurd as pt demonstrates instability with poor control of trunk.   Transfers Overall transfer level: Needs assistance Equipment used: Straight cane Transfers: Sit to/from Stand Sit to Stand: Min assist         General transfer comment: Pt braces posterior BLEs against side of bed to stabilize with sit>stand with min guard>min assist due to unsteadiness.   Ambulation/Gait Ambulation/Gait assistance: Min assist Ambulation Distance (Feet): 80 Feet Assistive device: Straight cane Gait Pattern/deviations: Step-through pattern;Staggering right;Staggering left;Narrow base of support   Gait velocity interpretation: at or above normal speed for age/gender General Gait Details: Pt with narrow BOS and staggers L and R occasionally, requiring min  assist to steady.     Stairs            Wheelchair Mobility    Modified Rankin (Stroke Patients Only)       Balance Overall balance assessment: Needs assistance Sitting-balance support: No upper extremity supported;Feet supported Sitting balance-Leahy Scale: Fair     Standing balance support: No upper extremity supported;During functional activity Standing balance-Leahy Scale: Fair Standing balance comment: Pt able to stand statically without UE support for short period of time, otherwise requires UE support             High level balance activites: Sudden stops;Direction changes;Turns;Other (comment)(stepping over object) High Level Balance Comments: Instability with higher level balance activities, requiring up to min assist to remain steady            Cognition Arousal/Alertness: Awake/alert Behavior During Therapy: Restless Overall Cognitive Status: History of cognitive impairments - at baseline                                 General Comments: Per chart review, dementia at baseline.       Exercises Other Exercises Other Exercises: SLS each LE for 25 seconds with 1UE support Other Exercises: Tandem stance x25 seconds each LE with 1UE support Other Exercises: Mini squats with BUEs supported on countertop x10 Other Exercises: Bil calf raises with BUEs supported on countertop x10 Other Exercises: Marching in place with BUEs supported on countertop x10 each LE    General Comments        Pertinent Vitals/Pain Pain Assessment: No/denies  pain Faces Pain Scale: No hurt Pain Intervention(s): Monitored during session    Home Living Family/patient expects to be discharged to:: Skilled nursing facility               Additional Comments: Pt unable to provide information regarding home layout, no family present.     Prior Function Level of Independence: Independent with assistive device(s)      Comments: Pt unable to provide  information regarding home layout, no family present. From chart review it seems that pt was living alone and ambulating with SPC at baseline.    PT Goals (current goals can now be found in the care plan section) Acute Rehab PT Goals Patient Stated Goal: unable to participate in goal setting PT Goal Formulation: Patient unable to participate in goal setting Time For Goal Achievement: 10/02/17 Potential to Achieve Goals: Good Progress towards PT goals: Progressing toward goals    Frequency    7X/week      PT Plan Current plan remains appropriate    Co-evaluation              AM-PAC PT "6 Clicks" Daily Activity  Outcome Measure  Difficulty turning over in bed (including adjusting bedclothes, sheets and blankets)?: None Difficulty moving from lying on back to sitting on the side of the bed? : A Little Difficulty sitting down on and standing up from a chair with arms (e.g., wheelchair, bedside commode, etc,.)?: Unable Help needed moving to and from a bed to chair (including a wheelchair)?: A Little Help needed walking in hospital room?: A Little Help needed climbing 3-5 steps with a railing? : A Little 6 Click Score: 17    End of Session Equipment Utilized During Treatment: Gait belt Activity Tolerance: Patient tolerated treatment well Patient left: in bed;with call bell/phone within reach;with nursing/sitter in room(sitter in room at all times) Nurse Communication: Mobility status PT Visit Diagnosis: Unsteadiness on feet (R26.81);Other abnormalities of gait and mobility (R26.89);Muscle weakness (generalized) (M62.81)     Time: [id number] PT Time Calculation (min) (ACUTE ONLY): 22 min  Charges:  $Therapeutic Exercise: 8-22 mins                    G Codes:       Encarnacion ChuAshley Abashian PT, DPT 09/19/2017, 1:50 PM

## 2017-09-19 NOTE — Progress Notes (Signed)
Pt has been cooperative and calm since last night. MD notified to D/C sitter order. Sitter discontinued at 13:15. Will continue to monitor. Otilio JeffersonMadelyn S Fenton, RN

## 2017-09-19 NOTE — Progress Notes (Signed)
Patient ID: Megan DakinMaxine H Arnold, female   DOB: 05/29/1921, 83 y.o.   MRN: [id number]  ACP note.  Case discussed with patient, daughter and grandson at the bedside.  Patient with a stroke, atrial fibrillation, hypertension and diabetes.  The patient has dementia and is unable to make good decisions for herself at this time.  I discussed CODE STATUS at length.  Family would like her to be a full code at this point in time.  I explained that I am hesitant on full anticoagulation with her stroke and atrial fibrillation and her unsteady gait.  They are agreeable to aspirin and Plavix at this time.  Time spent on ACP discussion 17 minutes  Dr. Alford Highlandichard Batoul Limes

## 2017-09-19 NOTE — Evaluation (Signed)
Occupational Therapy Evaluation Patient Details Name: Megan Arnold MRN: [id number] DOB: 1921-05-27 Today's Date: 09/19/2017    History of Present Illness Pt is a 83 y/o F who presented with worsening confusion (pt with dementia at baseline).  CT head showed new/acute CVA.  Pt's PMH includes mixed Alzheimer's and vascular dementia.    Clinical Impression   Pt seen for OT evaluation this date. Per chart review, pt was living alone independently in an apartment. Pt pleasant, alert, oriented to self, and hyper verbal with tangential comments and some word finding difficulties that pt demonstrates some awareness at times during the session. Pt requires min guard for bed mobility and min assist for transfers. Pt unable to doff/don sock independently while seated despite verbal cues to sequence and problem solve requiring min-mod assist for LB ADL tasks. Pt unable to adequately demonstrate safety awareness/insight into deficits during situational safety problem solving exercise, unsafe to manage medications. With difficulty, pt able to verbalize he is not at baseline, stating "I'm a deficit, not an asset right now" and endorses the need for increased assistance right now. Pt will benefit from skilled OT services to address deficits in strength, cognition, vision, safety awareness, and balance. Pt unsafe to return home alone at this time. Recommend STR to continue therapy in order to maximize functional independence and safety. Pt may ultimately benefit from ALF setting following rehab.    Follow Up Recommendations  SNF    Equipment Recommendations  None recommended by OT    Recommendations for Other Services       Precautions / Restrictions Precautions Precautions: Fall Restrictions Weight Bearing Restrictions: No      Mobility Bed Mobility Overal bed mobility: Needs Assistance Bed Mobility: Supine to Sit;Sit to Supine     Supine to sit: Min guard Sit to supine: Min guard    General bed mobility comments: verbal cues for sequencing  Transfers Overall transfer level: Needs assistance Equipment used: Straight cane Transfers: Sit to/from Stand Sit to Stand: Min assist              Balance Overall balance assessment: Needs assistance Sitting-balance support: No upper extremity supported;Feet supported Sitting balance-Leahy Scale: Fair     Standing balance support: No upper extremity supported;During functional activity Standing balance-Leahy Scale: Fair Standing balance comment: briefly stands statically without support but requires cane otherwise                           ADL either performed or assessed with clinical judgement   ADL Overall ADL's : Needs assistance/impaired Eating/Feeding: Sitting;Set up;Supervision/ safety   Grooming: Sitting;Set up;Supervision/safety   Upper Body Bathing: Set up;Supervision/ safety;Sitting   Lower Body Bathing: Sit to/from stand;Minimal assistance;Moderate assistance   Upper Body Dressing : Sitting;Set up;Supervision/safety   Lower Body Dressing: Sit to/from stand;Moderate assistance;Minimal assistance   Toilet Transfer: BSC;Minimal assistance;Stand-pivot           Functional mobility during ADLs: Minimal assistance;Cane;Cueing for safety       Vision Baseline Vision/History: (pt verbalizes visual deficits at baseline but unable to describe ) Patient Visual Report: Other (comment)(pt endorses visual changes but unable to verbalize details) Vision Assessment?: Vision impaired- to be further tested in functional context Additional Comments: R visual field deficit?     Perception     Praxis      Pertinent Vitals/Pain Pain Assessment: No/denies pain     Hand Dominance     Extremity/Trunk Assessment Upper Extremity  Assessment Upper Extremity Assessment: Generalized weakness(grossly 4/5 bilaterally, good grip bilaterally, intact sensation)   Lower Extremity Assessment Lower  Extremity Assessment: Generalized weakness(grossly 4/5 bilaterally, intact sensation)       Communication Communication Communication: No difficulties   Cognition Arousal/Alertness: Awake/alert Behavior During Therapy: WFL for tasks assessed/performed Overall Cognitive Status: History of cognitive impairments - at baseline                                 General Comments: Per chart review, dementia at baseline. Pt able to follow commands with minimal verbal cues to attend, distracted easily, hyperverbal with tangential comments and some mild difficulty with word finding.    General Comments       Exercises     Shoulder Instructions      Home Living Family/patient expects to be discharged to:: Skilled nursing facility                                 Additional Comments: Pt unable to provide information regarding home layout, no family present.       Prior Functioning/Environment Level of Independence: Independent with assistive device(s)        Comments: Pt unable to provide information regarding home layout, no family present. From chart review it seems that pt was living alone and ambulating with SPC at baseline.         OT Problem List: Decreased strength;Decreased knowledge of use of DME or AE;Decreased cognition;Decreased safety awareness;Impaired balance (sitting and/or standing)      OT Treatment/Interventions: Self-care/ADL training;Cognitive remediation/compensation;DME and/or AE instruction;Patient/family education    OT Goals(Current goals can be found in the care plan section) Acute Rehab OT Goals Patient Stated Goal: unable to participate in goal setting  OT Frequency: Min 1X/week   Barriers to D/C: Decreased caregiver support          Co-evaluation              AM-PAC PT "6 Clicks" Daily Activity     Outcome Measure Help from another person eating meals?: A Little Help from another person taking care of personal  grooming?: A Little Help from another person toileting, which includes using toliet, bedpan, or urinal?: A Little Help from another person bathing (including washing, rinsing, drying)?: A Lot Help from another person to put on and taking off regular upper body clothing?: A Little Help from another person to put on and taking off regular lower body clothing?: A Lot 6 Click Score: 16   End of Session    Activity Tolerance: Patient tolerated treatment well Patient left: in bed;with call bell/phone within reach;with bed alarm set;with nursing/sitter in room  OT Visit Diagnosis: Other abnormalities of gait and mobility (R26.89);Muscle weakness (generalized) (M62.81);Other symptoms and signs involving cognitive function;History of falling (Z91.81)                Time: 1127-1209 OT Time Calculation (min): 42 min Charges:  OT General Charges $OT Visit: 1 Visit OT Evaluation $OT Eval Low Complexity: 1 Low OT Treatments $Self Care/Home Management : 8-22 mins G-Codes: OT G-codes **NOT FOR INPATIENT CLASS** Functional Assessment Tool Used: AM-PAC 6 Clicks Daily Activity;Clinical judgement Functional Limitation: Self care Self Care Current Status (W0981(G8987): At least 40 percent but less than 60 percent impaired, limited or restricted Self Care Goal Status (X9147(G8988): At least 20 percent but less  than 40 percent impaired, limited or restricted   Richrd PrimeJamie Stiller, MPH, MS, OTR/L ascom [phone] 09/19/17, 1:31 PM

## 2017-09-19 NOTE — Evaluation (Signed)
Clinical/Bedside Swallow Evaluation Patient Details  Name: Megan DakinMaxine H Arnold MRN: [id number] Date of Birth: 06/09/1921  Today's Date: 09/19/2017 Time: SLP Start Time (ACUTE ONLY): 0840 SLP Stop Time (ACUTE ONLY): 0911 SLP Time Calculation (min) (ACUTE ONLY): 31 min  Past Medical History:  Past Medical History:  Diagnosis Date  . Hypertension    Past Surgical History: History reviewed. No pertinent surgical history. HPI:      Assessment / Plan / Recommendation Clinical Impression  pt presents with a minimal risk for aspiration as characterized by no overt ssx aspiration during intake of solids or liquids during intake. pt was able to independently feed self and a-p transit was appropriate. pt did require min cues to decrease talking during meals howver she appeared confused and often repeeated same statement multiple times. pt did have a sitter present in room due to confusion. pt may benefit from a cognitive linguistic evaluation in the next 1-2 days to determine if she is at baseline at this time.   SLP recommends a regular with thin diet with pt able to use a straw.  SLP Visit Diagnosis: Dysphagia, oral phase (R13.11)    Aspiration Risk  Mild aspiration risk    Diet Recommendation Regular;Thin liquid   Liquid Administration via: Cup;Straw Medication Administration: Whole meds with liquid Supervision: Patient able to self feed Compensations: Slow rate;Small sips/bites;Minimize environmental distractions;Follow solids with liquid Postural Changes: Seated upright at 90 degrees    Other  Recommendations Oral Care Recommendations: Patient independent with oral care   Follow up Recommendations        Frequency and Duration min 3x week  2 weeks       Prognosis Prognosis for Safe Diet Advancement: Good Barriers to Reach Goals: Cognitive deficits      Swallow Study   General Date of Onset: 09/18/17 Type of Study: Bedside Swallow Evaluation Diet Prior to this Study:  Dysphagia 3 (soft);Thin liquids Temperature Spikes Noted: No Respiratory Status: Room air History of Recent Intubation: No Behavior/Cognition: Alert;Confused;Cooperative Oral Cavity Assessment: Within Functional Limits Oral Care Completed by SLP: No Oral Cavity - Dentition: Missing dentition Vision: Functional for self-feeding Self-Feeding Abilities: Able to feed self Patient Positioning: Upright in bed Baseline Vocal Quality: Normal Volitional Cough: Strong Volitional Swallow: Able to elicit    Oral/Motor/Sensory Function Overall Oral Motor/Sensory Function: Within functional limits   Ice Chips Ice chips: Within functional limits Presentation: Self Fed;Spoon   Thin Liquid Thin Liquid: Within functional limits Presentation: Spoon;Self Fed;Straw    Nectar Thick Nectar Thick Liquid: Not tested   Honey Thick Honey Thick Liquid: Not tested   Puree Puree: Within functional limits Presentation: Self Fed;Spoon   Solid   GO   Solid: Within functional limits Presentation: Self Fed;Spoon    Functional Limitations: Swallowing Swallow Current Status (B1478(G8996): At least 1 percent but less than 20 percent impaired, limited or restricted Swallow Goal Status (G9562(G8997): 0 percent impaired, limited or restricted   FirstEnergy CorpStacie Harris Sauber 09/19/2017,10:47 AM

## 2017-09-19 NOTE — Progress Notes (Signed)
Patient ID: Megan Arnold, female   DOB: [birth year], 83 y.o.   MRN: [id number]  [facility] PROGRESS NOTE  TREYANA STURGELL WUJ:[id number] DOB: 1921/09/08 DOA: 09/17/2017 PCP: Yetta Barre (Inactive)  HPI/Subjective: Patient states she wants to leave the hospital as soon as possible.  Patient keeps on talking and does not give me time to talk.  States she feels okay.  States she her daughter brought her in.  Objective: Vitals:   09/19/17 1115 09/19/17 1243  BP: (!) 149/80 138/70  Pulse: 84 91  Resp:  18  Temp:  98.4 F (36.9 C)  SpO2:  100%    Filed Weights   09/17/17 1304 09/18/17 0553 09/19/17 0500  Weight: 56.7 kg (125 lb) 59.2 kg (130 lb 9.6 oz) 58.9 kg (129 lb 14.4 oz)    ROS: Review of Systems  Unable to perform ROS: Mental acuity  Respiratory: Negative for shortness of breath.   Cardiovascular: Negative for chest pain.  Gastrointestinal: Negative for abdominal pain.   Exam: Physical Exam  HENT:  Nose: No mucosal edema.  Mouth/Throat: No oropharyngeal exudate or posterior oropharyngeal edema.  Eyes: Conjunctivae, EOM and lids are normal. Pupils are equal, round, and reactive to light.  Neck: No JVD present. Carotid bruit is not present. No edema present. No thyroid mass and no thyromegaly present.  Cardiovascular: S1 normal and S2 normal. An irregularly irregular rhythm present. Exam reveals no gallop.  No murmur heard. Pulses:      Dorsalis pedis pulses are 2+ on the right side, and 2+ on the left side.  Respiratory: No respiratory distress. She has no wheezes. She has no rhonchi. She has no rales.  GI: Soft. Bowel sounds are normal. There is no tenderness.  Musculoskeletal:       Right ankle: She exhibits no swelling.       Left ankle: She exhibits no swelling.  Lymphadenopathy:    She has no cervical adenopathy.  Neurological: She is alert. No cranial nerve deficit.  Power 5 out of 5 bilaterally  Skin: Skin is warm. No rash noted. Nails show no  clubbing.  Psychiatric: Her speech is rapid and/or pressured.      Data Reviewed: Basic Metabolic Panel: Recent Labs  Lab 09/17/17 1310 09/18/17 0508 09/19/17 0409  NA 135 139 142  K 3.2* 3.2* 3.8  CL 101 109 111  CO2 25 22 25   GLUCOSE 190* 156* 105*  BUN 21* 19 17  CREATININE 1.09* 1.00 1.17*  CALCIUM 9.2 8.5* 8.7*   Liver Function Tests: Recent Labs  Lab 09/17/17 1310 09/18/17 0508  AST 28 22  ALT 17 12*  ALKPHOS 65 52  BILITOT 1.1 1.0  PROT 7.9 6.5  ALBUMIN 4.5 3.5   CBC: Recent Labs  Lab 09/17/17 1310 09/18/17 0508  WBC 9.4 8.5  NEUTROABS 7.0*  --   HGB 13.9 12.8  HCT 41.5 37.2  MCV 83.7 83.7  PLT 261 221    CBG: Recent Labs  Lab 09/18/17 1711 09/18/17 1803 09/18/17 2147 09/19/17 0731 09/19/17 1222  GLUCAP 65 116* 140* 107* 229*     Studies: Mr Brain Wo Contrast  Result Date: 09/19/2017 CLINICAL DATA:  Worsening dementia. EXAM: MRI HEAD WITHOUT CONTRAST TECHNIQUE: Multiplanar, multiecho pulse sequences of the brain and surrounding structures were obtained without intravenous contrast. COMPARISON:  CT 09/17/2017 FINDINGS: Brain: Subacute infarction in the left PCA territory affecting the posteromedial temporal lobe, occipital lobe, left thalamus and left corpus callosum and radiating white matter tracts.  No other acute infarction. Brainstem shows minimal small vessel change in the pons. No cerebellar insult. Cerebral hemispheres otherwise show mild age related volume loss with chronic small-vessel ischemic changes of the white matter. No mass lesion, hemorrhage, hydrocephalus or extra-axial collection. Vascular: Major vessels at the base of the brain show flow. Skull and upper cervical spine: Negative Sinuses/Orbits: Clear/normal Other: None IMPRESSION: Subacute infarction in the left PCA territory. Mild swelling but no hemorrhage or mass effect. Background pattern of mild age related volume loss and small-vessel ischemic changes of the white matter.  Electronically Signed   By: Paulina FusiMark  Shogry M.D.   On: 09/19/2017 10:26   [place] Carotid Bilateral  Result Date: 09/17/2017 CLINICAL DATA:  Cerebrovascular accident EXAM: BILATERAL CAROTID DUPLEX ULTRASOUND TECHNIQUE: Wallace CullensGray scale imaging, color Doppler and duplex ultrasound were performed of bilateral carotid and vertebral arteries in the neck. COMPARISON:  None. FINDINGS: Criteria: Quantification of carotid stenosis is based on velocity parameters that correlate the residual internal carotid diameter with NASCET-based stenosis levels, using the diameter of the distal internal carotid lumen as the denominator for stenosis measurement. The following velocity measurements were obtained: RIGHT ICA:  50/17 cm/sec CCA:  80/6 cm/sec SYSTOLIC ICA/CCA RATIO:  0.63 DIASTOLIC ICA/CCA RATIO:  2.8 ECA:  88 cm/sec LEFT ICA:  43/11 cm/sec CCA:  104/12 cm/sec SYSTOLIC ICA/CCA RATIO:  0.41 DIASTOLIC ICA/CCA RATIO:  0.91 ECA:  85 cm/sec RIGHT CAROTID ARTERY: Preliminary grayscale images demonstrate some intimal thickening within the common carotid artery. No significant atherosclerotic plaque is noted. Waveforms, velocities and flow velocity ratios show no evidence of focal hemodynamically significant stenosis. RIGHT VERTEBRAL ARTERY:  Antegrade in nature LEFT CAROTID ARTERY: Preliminary grayscale images demonstrate mild intimal thickening within the common carotid artery. Minimal plaque is noted in the region of the carotid bulb. The waveforms, velocities and flow velocity ratios show no evidence of focal hemodynamically significant stenosis. LEFT VERTEBRAL ARTERY:  Antegrade in nature. IMPRESSION: Mild atherosclerotic changes without focal hemodynamically significant stenosis. Electronically Signed   By: Alcide CleverMark  Lukens M.D.   On: 09/17/2017 16:50    Scheduled Meds: . aspirin EC  81 mg Oral Daily  . atorvastatin  40 mg Oral q1800  . [START ON 09/20/2017] ciprofloxacin  500 mg Oral Daily  . clopidogrel  75 mg Oral Daily  . docusate  sodium  100 mg Oral BID  . glimepiride  2 mg Oral Q breakfast  . heparin  5,000 Units Subcutaneous Q8H  . insulin aspart  0-9 Units Subcutaneous TID WC  . linagliptin  5 mg Oral Daily  . lisinopril  10 mg Oral Daily  . memantine  28 mg Oral Daily  . metoprolol succinate  25 mg Oral BID  . pantoprazole  40 mg Oral Daily  . QUEtiapine  25 mg Oral QHS   Continuous Infusions:   Assessment/Plan:  1. Acute to subacute stroke involving left PCA territory.  I do not think I will be able to get the patient into an MRI machine at this time.  Carotid ultrasound unremarkable.  Still awaiting echocardiogram.  The patient does have atrial fibrillation but I am hesitant on full anticoagulation because her fall risk and dementia.  Patient currently on aspirin and Plavix. 2. Dementia with behavioral disturbance.  Patient not competent to make medical decisions.  Spoke with family about obtaining power of attorney paperwork.  Discontinue sitter. 3. Atrial fibrillation on metoprolol.  Hesitant on full anticoagulation with her dementia 4. Essential hypertension on metoprolol.  I will lower  the dose of lisinopril to 10 mg daily 5. GERD on Protonix 6. Hyperlipidemia unspecified.  Change pravastatin to Lipitor 7. Type 2 diabetes mellitus.  The patient's hemoglobin A1c is 6.7.  Patient on sliding scale and oral medication 8. Acute cystitis on Cipro follow-up urine culture 9. Hypokalemia replace potassium orally  Code Status:     Code Status Orders  (From admission, onward)        Start     Ordered   09/17/17 1615  Full code  Continuous     09/17/17 1614    Code Status History    Date Active Date Inactive Code Status Order ID Comments User Context   This patient has a current code status but no historical code status.     Family Communication: Spoke with daughter and grandson at the bedside Disposition Plan: To rehab soon  Consultants:  Cardiology  Neurology  Psychiatry  Time spent: 40  minutes in coordination of care, including ACP time  Loews Corporationichard Bern Fare  [facility]

## 2017-09-20 LAB — URINE CULTURE: Culture: NO GROWTH

## 2017-09-20 LAB — GLUCOSE, CAPILLARY: Glucose-Capillary: 80 mg/dL (ref 65–99)

## 2017-09-20 LAB — ECHOCARDIOGRAM COMPLETE
Height: 63 in
Weight: 2078.4 [oz_av]

## 2017-09-20 LAB — RPR: RPR Ser Ql: NONREACTIVE

## 2017-09-20 MED ORDER — LISINOPRIL 10 MG PO TABS: 10.0000 mg | ORAL_TABLET | Freq: Every day | ORAL | 0 refills | Status: AC

## 2017-09-20 MED ORDER — METOPROLOL SUCCINATE ER 25 MG PO TB24: 25.0000 mg | ORAL_TABLET | Freq: Every day | ORAL | 0 refills | Status: AC

## 2017-09-20 MED ORDER — QUETIAPINE FUMARATE 25 MG PO TABS: 25.0000 mg | ORAL_TABLET | Freq: Every day | ORAL | 0 refills | Status: DC

## 2017-09-20 MED ORDER — METOPROLOL SUCCINATE ER 25 MG PO TB24: 25.0000 mg | ORAL_TABLET | Freq: Every day | ORAL | Status: DC

## 2017-09-20 MED ORDER — GLIMEPIRIDE 2 MG PO TABS: 2.0000 mg | ORAL_TABLET | Freq: Every day | ORAL | 0 refills | Status: DC

## 2017-09-20 MED ORDER — CLOPIDOGREL BISULFATE 75 MG PO TABS: 75.0000 mg | ORAL_TABLET | Freq: Every day | ORAL | 0 refills | Status: DC

## 2017-09-20 MED ORDER — ACETAMINOPHEN 325 MG PO TABS: 650.0000 mg | ORAL_TABLET | Freq: Four times a day (QID) | ORAL | Status: AC | PRN

## 2017-09-20 MED ORDER — ATORVASTATIN CALCIUM 40 MG PO TABS: 40.0000 mg | ORAL_TABLET | Freq: Every day | ORAL | 0 refills | Status: AC

## 2017-09-20 MED ORDER — DOCUSATE SODIUM 100 MG PO CAPS: 100.0000 mg | ORAL_CAPSULE | Freq: Two times a day (BID) | ORAL | 0 refills | Status: AC

## 2017-09-20 NOTE — Care Management Important Message (Signed)
Important Message  Patient Details  Name: Megan Arnold MRN: [id number] Date of Birth: 12/27/1920   Medicare Important Message Given:  Yes Signed IM notice given    Eber HongGreene, Emrie Gayle R, RN 09/20/2017, 12:15 PM

## 2017-09-20 NOTE — Progress Notes (Signed)
Pt discharged [facility]. Report called to accepting RN. Pt on room air and in no distress. Pt's home meds left in Med room. Will contact family to come pick them up. Otilio JeffersonMadelyn S Fenton, RN

## 2017-09-20 NOTE — Progress Notes (Signed)
PT Cancellation Note  Patient Details Name: Megan Arnold MRN: [id number] DOB: 07/15/1921   Cancelled Treatment:    Reason Eval/Treat Not Completed: Patient at procedure or test/unavailable; Patient was having her lunch.   Jones BroomMansfield, Alexea Blase S,PT DPT 09/20/2017, 2:15 PM

## 2017-09-20 NOTE — Discharge Summary (Signed)
[facility] - [place] [facility]   PATIENT NAME: Megan Arnold    MR#:  [id number]  DATE OF BIRTH:  [birth year]  DATE OF ADMISSION:  09/17/2017 ADMITTING PHYSICIAN: Marguarite Arbour, MD  DATE OF DISCHARGE: 09/20/2017  PRIMARY CARE PHYSICIAN: Yetta Barre (Inactive)    ADMISSION DIAGNOSIS:  CVA (cerebral vascular accident) (HCC) [I63.9] Altered mental status, unspecified altered mental status type [R41.82] Cerebrovascular accident (CVA), unspecified mechanism (HCC) [I63.9] Atrial fibrillation, unspecified type (HCC) [I48.91]  DISCHARGE DIAGNOSIS:  Principal Problem:   Mixed Alzheimer's and vascular dementia Active Problems:   CVA (cerebrovascular accident) (HCC)   HTN (hypertension)   Diabetes (HCC)   CVA (cerebral vascular accident) (HCC)   SECONDARY DIAGNOSIS:   Past Medical History:  Diagnosis Date  . Hypertension     HOSPITAL COURSE:   1.  Acute to subacute stroke involving the left PCA territory on MRI.  Carotid ultrasound unremarkable.  Echocardiogram done yesterday afternoon and Dr. Park Breed will read this morning.  I will look at results prior to disposition.  The patient does have atrial fibrillation.  I spoke with the daughter about anticoagulation options.  I am hesitant about full dose anticoagulation because of her fall risk and dementia.  I spoke with the daughter about this and she does not want Eliquis at this time.  We know that her stroke risk will be higher off anticoagulation.  The daughter was comfortable with aspirin and Plavix and statin at this time. 2.  Dementia with behavioral disturbance.  The patient was seen by psychiatry and is not competent about making medical decisions at this time.  I spoke with family about obtaining power of attorney paperwork.  I am giving Seroquel at night for sleep.  Patient will go out to rehab today and family is thinking about options after rehab. 3.  Atrial fibrillation.  The patient did have a  2.14-second pause.  Case discussed with Dr. Park Breed to decrease metoprolol dose to 25 mg nightly. 4.  Essential hypertension on metoprolol and lisinopril 5.  Hyperlipidemia unspecified change pravastatin to Lipitor 6.  Type 2 diabetes mellitus.  Patient's hemoglobin A1c is 6.7.  Patient is on oral medication.  I decreased her glimepiride dose to 2 mg daily. 7.  Acute cystitis.  Urine culture negative.  Discontinue antibiotics. 8.  Hypokalemia.  Potassium replaced during hospital course.  DISCHARGE CONDITIONS:   Fair  CONSULTS OBTAINED:  Treatment Team:  Laurier Nancy, MD Thana Farr, MD Shari Prows, MD  DRUG ALLERGIES:   Allergies  Allergen Reactions  . Penicillins     Has patient had a PCN reaction causing immediate rash, facial/tongue/throat swelling, SOB or lightheadedness with hypotension: Yes Has patient had a PCN reaction causing severe rash involving mucus membranes or skin necrosis: Yes Has patient had a PCN reaction that required hospitalization: Yes Has patient had a PCN reaction occurring within the last 10 years: No If all of the above answers are "NO", then may proceed with Cephalosporin use.     DISCHARGE MEDICATIONS:   Allergies as of 09/20/2017      Reactions   Penicillins    Has patient had a PCN reaction causing immediate rash, facial/tongue/throat swelling, SOB or lightheadedness with hypotension: Yes Has patient had a PCN reaction causing severe rash involving mucus membranes or skin necrosis: Yes Has patient had a PCN reaction that required hospitalization: Yes Has patient had a PCN reaction occurring within the last 10 years: No If all of the  above answers are "NO", then may proceed with Cephalosporin use.      Medication List    STOP taking these medications   pravastatin 40 MG tablet Commonly known as:  PRAVACHOL     TAKE these medications   acetaminophen 325 MG tablet Commonly known as:  TYLENOL Take 2 tablets (650 mg total) by  mouth every 6 (six) hours as needed for mild pain (or Fever >/= 101).   aspirin EC 81 MG tablet Take 81 mg by mouth daily.   atorvastatin 40 MG tablet Commonly known as:  LIPITOR Take 1 tablet (40 mg total) by mouth daily at 6 PM.   bisacodyl 5 MG EC tablet Commonly known as:  DULCOLAX Take 10 mg by mouth daily as needed for moderate constipation.   clopidogrel 75 MG tablet Commonly known as:  PLAVIX Take 1 tablet (75 mg total) by mouth daily.   docusate sodium 100 MG capsule Commonly known as:  COLACE Take 1 capsule (100 mg total) by mouth 2 (two) times daily.   glimepiride 2 MG tablet Commonly known as:  AMARYL Take 1 tablet (2 mg total) by mouth daily with breakfast. Start taking on:  09/21/2017 What changed:    medication strength  how much to take  when to take this   JANUVIA 100 MG tablet Generic drug:  sitaGLIPtin Take 100 mg by mouth daily.   LIPOFLAVONOID Tabs Take by mouth as directed.   lisinopril 10 MG tablet Commonly known as:  PRINIVIL,ZESTRIL Take 1 tablet (10 mg total) by mouth daily. What changed:    medication strength  how much to take   metoprolol succinate 25 MG 24 hr tablet Commonly known as:  TOPROL-XL Take 1 tablet (25 mg total) by mouth at bedtime. What changed:  when to take this   NAMENDA XR 28 MG Cp24 24 hr capsule Generic drug:  memantine Take 28 mg by mouth daily.   QUEtiapine 25 MG tablet Commonly known as:  SEROQUEL Take 1 tablet (25 mg total) by mouth at bedtime.   Vitamin B-12 5000 MCG Tbdp Take 5,000 Units by mouth daily.        DISCHARGE INSTRUCTIONS:   Follow-up with Dr. at rehab 1 day Follow-up with Dr. Park Breed cardiology in 2 weeks  If you experience worsening of your admission symptoms, develop shortness of breath, life threatening emergency, suicidal or homicidal thoughts you must seek medical attention immediately by calling 911 or calling your MD immediately  if symptoms less severe.  You Must read  complete instructions/literature along with all the possible adverse reactions/side effects for all the Medicines you take and that have been prescribed to you. Take any new Medicines after you have completely understood and accept all the possible adverse reactions/side effects.   Please note  You were cared for by a hospitalist during your hospital stay. If you have any questions about your discharge medications or the care you received while you were in the hospital after you are discharged, you can call the unit and asked to speak with the hospitalist on call if the hospitalist that took care of you is not available. Once you are discharged, your primary care physician will handle any further medical issues. Please note that NO REFILLS for any discharge medications will be authorized once you are discharged, as it is imperative that you return to your primary care physician (or establish a relationship with a primary care physician if you do not have one) for your aftercare needs  so that they can reassess your need for medications and monitor your lab values.    Today   CHIEF COMPLAINT:   Chief Complaint  Patient presents with  . Altered Mental Status    HISTORY OF PRESENT ILLNESS:  Megan Arnold  is a 84 y.o. female with a known history of dementia presented with altered mental status found to have stroke and UTI   VITAL SIGNS:  Blood pressure (!) 157/83, pulse 79, temperature (!) 97.5 F (36.4 C), temperature source Oral, resp. rate 20, height 5\' 3"  (1.6 m), weight 59 kg (130 lb), SpO2 98 %.    PHYSICAL EXAMINATION:  GENERAL:  84 y.o.-year-old patient lying in the bed with no acute distress.  EYES: Pupils equal, round, reactive to light and accommodation. No scleral icterus. Extraocular muscles intact.  HEENT: Head atraumatic, normocephalic. Oropharynx and nasopharynx clear.  NECK:  Supple, no jugular venous distention. No thyroid enlargement, no tenderness.  LUNGS: Normal  breath sounds bilaterally, no wheezing, rales,rhonchi or crepitation. No use of accessory muscles of respiration.  CARDIOVASCULAR: S1, S2 normal. No murmurs, rubs, or gallops.  ABDOMEN: Soft, non-tender, non-distended. Bowel sounds present. No organomegaly or mass.  EXTREMITIES: No pedal edema, cyanosis, or clubbing.  NEUROLOGIC: Cranial nerves II through XII are intact. Muscle strength 5/5 in all extremities. Sensation intact. Gait not checked.  PSYCHIATRIC: The patient is alert and answers questions.  SKIN: No obvious rash, lesion, or ulcer.   DATA REVIEW:   CBC Recent Labs  Lab 09/18/17 0508  WBC 8.5  HGB 12.8  HCT 37.2  PLT 221    Chemistries  Recent Labs  Lab 09/18/17 0508 09/19/17 0409  NA 139 142  K 3.2* 3.8  CL 109 111  CO2 22 25  GLUCOSE 156* 105*  BUN 19 17  CREATININE 1.00 1.17*  CALCIUM 8.5* 8.7*  AST 22  --   ALT 12*  --   ALKPHOS 52  --   BILITOT 1.0  --     Microbiology Results  Results for orders placed or performed during the hospital encounter of 09/17/17  Urine Culture     Status: None   Collection Time: 09/18/17  4:37 PM  Result Value Ref Range Status   Specimen Description   Final    URINE, RANDOM Performed at [facility], [address] [place], [place] [address]    Special Requests   Final    NONE Performed at [facility], [address] [place], [place] [address]    Culture   Final    NO GROWTH Performed at [facility], [address] [place], [place] [address]    Report Status 09/20/2017 FINAL  Final    RADIOLOGY:  Mr Brain Wo Contrast  Result Date: 09/19/2017 CLINICAL DATA:  Worsening dementia. EXAM: MRI HEAD WITHOUT CONTRAST TECHNIQUE: Multiplanar, multiecho pulse sequences of the brain and surrounding structures were obtained without intravenous contrast. COMPARISON:  CT 09/17/2017 FINDINGS: Brain: Subacute infarction in the left PCA territory affecting the posteromedial temporal lobe, occipital  lobe, left thalamus and left corpus callosum and radiating white matter tracts. No other acute infarction. Brainstem shows minimal small vessel change in the pons. No cerebellar insult. Cerebral hemispheres otherwise show mild age related volume loss with chronic small-vessel ischemic changes of the white matter. No mass lesion, hemorrhage, hydrocephalus or extra-axial collection. Vascular: Major vessels at the base of the brain show flow. Skull and upper cervical spine: Negative Sinuses/Orbits: Clear/normal Other: None IMPRESSION: Subacute  infarction in the left PCA territory. Mild swelling but no hemorrhage or mass effect. Background pattern of mild age related volume loss and small-vessel ischemic changes of the white matter. Electronically Signed   By: Paulina Fusi M.D.   On: 09/19/2017 10:26     Management plans discussed with the patient, family and they are in agreement.  CODE STATUS:     Code Status Orders  (From admission, onward)        Start     Ordered   09/17/17 1615  Full code  Continuous     09/17/17 1614    Code Status History    Date Active Date Inactive Code Status Order ID Comments User Context   This patient has a current code status but no historical code status.      TOTAL TIME TAKING CARE OF THIS PATIENT: 35 minutes.    Alford Highland M.D on 09/20/2017 at 9:48 AM  Between 7am to 6pm - Pager - [phone]  After 6pm go to [email or web address] - password [email or web address]  [employer] Office  [phone]  CC: Primary care physician; Yetta Barre (Inactive)

## 2017-09-20 NOTE — Clinical Social Work Note (Signed)
CSW received phone call from [facility], they can accept patient today if she is medically ready for discharge and orders have been received.  Patient will be able to go to room 42B.  Ervin KnackEric R. Cathyann Kilfoyle, MSW, Theresia MajorsLCSWA [phone]  09/20/2017 9:48 AM

## 2017-09-20 NOTE — Clinical Social Work Note (Addendum)
Patient to be d/c'ed today to [facility].  Patient and family agreeable to plans will transport via ems RN to call report to room 42B [phone].  CSW left message on daughter's voice mail.  11:30am CSW received phone call from patient's daughter, who is aware that patient will be discharging to [facility] today.  Windell MouldingEric Izzabelle Bouley, MSW, Theresia MajorsLCSWA [phone]

## 2017-09-20 NOTE — Progress Notes (Signed)
SUBJECTIVE: Patient is confused   Vitals:   09/19/17 1243 09/19/17 2009 09/20/17 0001 09/20/17 0513  BP: 138/70 (!) 126/53 129/64 (!) 157/83  Pulse: 91 85 87 79  Resp: 18 20 16 20   Temp: 98.4 F (36.9 C) 97.7 F (36.5 C) 97.7 F (36.5 C) (!) 97.5 F (36.4 C)  TempSrc: Oral  Oral Oral  SpO2: 100% 98% 97% 98%  Weight:    130 lb (59 kg)  Height:        Intake/Output Summary (Last 24 hours) at 09/20/2017 1132 Last data filed at 09/19/2017 1842 Gross per 24 hour  Intake 240 ml  Output -  Net 240 ml    LABS: Basic Metabolic Panel: Recent Labs    09/18/17 0508 09/19/17 0409  NA 139 142  K 3.2* 3.8  CL 109 111  CO2 22 25  GLUCOSE 156* 105*  BUN 19 17  CREATININE 1.00 1.17*  CALCIUM 8.5* 8.7*   Liver Function Tests: Recent Labs    09/17/17 1310 09/18/17 0508  AST 28 22  ALT 17 12*  ALKPHOS 65 52  BILITOT 1.1 1.0  PROT 7.9 6.5  ALBUMIN 4.5 3.5   No results for input(s): LIPASE, AMYLASE in the last 72 hours. CBC: Recent Labs    09/17/17 1310 09/18/17 0508  WBC 9.4 8.5  NEUTROABS 7.0*  --   HGB 13.9 12.8  HCT 41.5 37.2  MCV 83.7 83.7  PLT 261 221   Cardiac Enzymes: No results for input(s): CKTOTAL, CKMB, CKMBINDEX, TROPONINI in the last 72 hours. BNP: Invalid input(s): POCBNP D-Dimer: No results for input(s): DDIMER in the last 72 hours. Hemoglobin A1C: Recent Labs    09/18/17 0508  HGBA1C 6.7*   Fasting Lipid Panel: Recent Labs    09/19/17 0409  CHOL 135  HDL 49  LDLCALC 62  TRIG 119  CHOLHDL 2.8   Thyroid Function Tests: Recent Labs    09/19/17 0409  TSH 5.533*   Anemia Panel: Recent Labs    09/19/17 0409  VITAMINB12 391     PHYSICAL EXAM General: Well developed, well nourished, in no acute distress HEENT:  Normocephalic and atramatic Neck:  No JVD.  Lungs: Clear bilaterally to auscultation and percussion. Heart: HRRR . Normal S1 and S2 without gallops or murmurs.  Abdomen: Bowel sounds are positive, abdomen soft and  non-tender  Msk:  Back normal, normal gait. Normal strength and tone for age. Extremities: No clubbing, cyanosis or edema.   Neuro: Alert and oriented X 3. Psych:  Good affect, responds appropriately  TELEMETRY: Atrial fibrillation about 100 bpm  ASSESSMENT AND PLAN: Atrial fibrillation with controlled ventricular rate with some pauses but with the metoprolol 25 mg once a day is maintaining reasonable ventricular rate. May go home on this regimen. Left ventricle ejection fraction was normal.   Principal Problem:   Mixed Alzheimer's and vascular dementia Active Problems:   CVA (cerebrovascular accident) (HCC)   HTN (hypertension)   Diabetes (HCC)   CVA (cerebral vascular accident) (HCC)    Aryella Besecker A, MD, [facility] 09/20/2017 11:32 AM

## 2017-09-20 NOTE — Clinical Social Work Placement (Signed)
   CLINICAL SOCIAL WORK PLACEMENT  NOTE  Date:  09/20/2017  Patient Details  Name: Megan Arnold MRN: [id number] Date of Birth: 02/09/1921  Clinical Social Work is seeking post-discharge placement for this patient at the Skilled  Nursing Facility level of care (*CSW will initial, date and re-position this form in  chart as items are completed):  Yes   Patient/family provided with [facility] Clinical Social Work Department's list of facilities offering this level of care within the geographic area requested by the patient (or if unable, by the patient's family).  Yes   Patient/family informed of their freedom to choose among providers that offer the needed level of care, that participate in Medicare, Medicaid or managed care program needed by the patient, have an available bed and are willing to accept the patient.  Yes   Patient/family informed of [facility]'s ownership interest in [facility] and [facility], as well as of the fact that they are under no obligation to receive care at these facilities.  PASRR submitted to EDS on 09/18/17     PASRR number received on 09/18/17     Existing PASRR number confirmed on       FL2 transmitted to all facilities in geographic area requested by pt/family on 09/18/17     FL2 transmitted to all facilities within larger geographic area on       Patient informed that his/her managed care company has contracts with or will negotiate with certain facilities, including the following:        Yes   Patient/family informed of bed offers received.  Patient chooses bed at [facility]     Physician recommends and patient chooses bed at      Patient to be transferred to [facility] on 09/20/17.  Patient to be transferred to facility by [facility]     Patient family notified on 09/20/17 of transfer.  Name of family member notified:  Patient's daughter Megan Arnold left a message on her voice mail.      PHYSICIAN Please sign FL2     Additional Comment:    _______________________________________________ Darleene CleaverAnterhaus, Daniel Johndrow R, LCSWA 09/20/2017, 11:04 AM

## 2017-09-20 NOTE — Progress Notes (Signed)
Notified by central tele that pt had a 2.14 sec pause in rhythm. MD made aware. No new orders. Continue to monitor.

## 2017-10-25 ENCOUNTER — Emergency Department: Payer: Medicare Other

## 2017-10-25 ENCOUNTER — Other Ambulatory Visit: Payer: Self-pay

## 2017-10-25 ENCOUNTER — Inpatient Hospital Stay
Admission: EM | Admit: 2017-10-25 | Discharge: 2017-10-31 | DRG: 392 | Disposition: A | Discharge status: Skilled Nursing Facility | Payer: Medicare Other | Attending: Internal Medicine | Admitting: Internal Medicine

## 2017-10-25 DIAGNOSIS — Z880 Allergy status to penicillin: Secondary | ICD-10-CM

## 2017-10-25 DIAGNOSIS — G309 Alzheimer's disease, unspecified: Secondary | ICD-10-CM | POA: Diagnosis present

## 2017-10-25 DIAGNOSIS — Z8673 Personal history of transient ischemic attack (TIA), and cerebral infarction without residual deficits: Secondary | ICD-10-CM

## 2017-10-25 DIAGNOSIS — R4182 Altered mental status, unspecified: Secondary | ICD-10-CM | POA: Diagnosis not present

## 2017-10-25 DIAGNOSIS — E119 Type 2 diabetes mellitus without complications: Secondary | ICD-10-CM | POA: Diagnosis present

## 2017-10-25 DIAGNOSIS — R296 Repeated falls: Secondary | ICD-10-CM | POA: Diagnosis present

## 2017-10-25 DIAGNOSIS — F0150 Vascular dementia without behavioral disturbance: Secondary | ICD-10-CM | POA: Diagnosis present

## 2017-10-25 DIAGNOSIS — G9340 Encephalopathy, unspecified: Secondary | ICD-10-CM

## 2017-10-25 DIAGNOSIS — S41111A Laceration without foreign body of right upper arm, initial encounter: Secondary | ICD-10-CM | POA: Diagnosis present

## 2017-10-25 DIAGNOSIS — Z7901 Long term (current) use of anticoagulants: Secondary | ICD-10-CM

## 2017-10-25 DIAGNOSIS — Y9301 Activity, walking, marching and hiking: Secondary | ICD-10-CM | POA: Diagnosis present

## 2017-10-25 DIAGNOSIS — Z515 Encounter for palliative care: Secondary | ICD-10-CM

## 2017-10-25 DIAGNOSIS — F02818 Dementia in other diseases classified elsewhere, unspecified severity, with other behavioral disturbance: Secondary | ICD-10-CM | POA: Diagnosis present

## 2017-10-25 DIAGNOSIS — Z9181 History of falling: Secondary | ICD-10-CM

## 2017-10-25 DIAGNOSIS — N3001 Acute cystitis with hematuria: Secondary | ICD-10-CM | POA: Diagnosis present

## 2017-10-25 DIAGNOSIS — E876 Hypokalemia: Secondary | ICD-10-CM | POA: Diagnosis present

## 2017-10-25 DIAGNOSIS — I10 Essential (primary) hypertension: Secondary | ICD-10-CM | POA: Diagnosis present

## 2017-10-25 DIAGNOSIS — W1830XA Fall on same level, unspecified, initial encounter: Secondary | ICD-10-CM | POA: Diagnosis present

## 2017-10-25 DIAGNOSIS — Z66 Do not resuscitate: Secondary | ICD-10-CM | POA: Diagnosis present

## 2017-10-25 DIAGNOSIS — Y92129 Unspecified place in nursing home as the place of occurrence of the external cause: Secondary | ICD-10-CM

## 2017-10-25 DIAGNOSIS — K529 Noninfective gastroenteritis and colitis, unspecified: Secondary | ICD-10-CM | POA: Diagnosis not present

## 2017-10-25 DIAGNOSIS — M5030 Other cervical disc degeneration, unspecified cervical region: Secondary | ICD-10-CM | POA: Diagnosis present

## 2017-10-25 DIAGNOSIS — Z7982 Long term (current) use of aspirin: Secondary | ICD-10-CM

## 2017-10-25 DIAGNOSIS — N179 Acute kidney failure, unspecified: Secondary | ICD-10-CM | POA: Diagnosis present

## 2017-10-25 DIAGNOSIS — Z79899 Other long term (current) drug therapy: Secondary | ICD-10-CM

## 2017-10-25 DIAGNOSIS — F0281 Dementia in other diseases classified elsewhere with behavioral disturbance: Secondary | ICD-10-CM | POA: Diagnosis present

## 2017-10-25 DIAGNOSIS — E860 Dehydration: Secondary | ICD-10-CM | POA: Diagnosis present

## 2017-10-25 DIAGNOSIS — I4891 Unspecified atrial fibrillation: Secondary | ICD-10-CM | POA: Diagnosis present

## 2017-10-25 HISTORY — DX: Altered mental status, unspecified: R41.82

## 2017-10-25 HISTORY — DX: Cognitive communication deficit: R41.841

## 2017-10-25 HISTORY — DX: Repeated falls: R29.6

## 2017-10-25 HISTORY — DX: Type 2 diabetes mellitus without complications: E11.9

## 2017-10-25 HISTORY — DX: Unspecified atrial fibrillation: I48.91

## 2017-10-25 HISTORY — DX: Unspecified dementia, unspecified severity, without behavioral disturbance, psychotic disturbance, mood disturbance, and anxiety: F03.90

## 2017-10-25 HISTORY — DX: Cerebral infarction, unspecified: I63.9

## 2017-10-25 LAB — CBC WITH DIFFERENTIAL/PLATELET
BASOS ABS: 0.1 10*3/uL (ref 0–0.1)
Basophils Relative: 1 %
EOS PCT: 0 %
Eosinophils Absolute: 0.1 10*3/uL (ref 0–0.7)
HCT: 38 % (ref 35.0–47.0)
Hemoglobin: 12.6 g/dL (ref 12.0–16.0)
Lymphocytes Relative: 14 %
Lymphs Abs: 2.2 10*3/uL (ref 1.0–3.6)
MCH: 28 pg (ref 26.0–34.0)
MCHC: 33.2 g/dL (ref 32.0–36.0)
MCV: 84.4 fL (ref 80.0–100.0)
MONO ABS: 1.2 10*3/uL — AB (ref 0.2–0.9)
Monocytes Relative: 8 %
Neutro Abs: 12.1 10*3/uL — ABNORMAL HIGH (ref 1.4–6.5)
Neutrophils Relative %: 77 %
PLATELETS: 258 10*3/uL (ref 150–440)
RBC: 4.5 MIL/uL (ref 3.80–5.20)
RDW: 14.4 % (ref 11.5–14.5)
WBC: 15.6 10*3/uL — ABNORMAL HIGH (ref 3.6–11.0)

## 2017-10-25 LAB — URINALYSIS, COMPLETE (UACMP) WITH MICROSCOPIC
BILIRUBIN URINE: NEGATIVE
Bacteria, UA: NONE SEEN
GLUCOSE, UA: NEGATIVE mg/dL
HGB URINE DIPSTICK: NEGATIVE
Ketones, ur: 20 mg/dL — AB
NITRITE: NEGATIVE
PH: 5 (ref 5.0–8.0)
Protein, ur: NEGATIVE mg/dL
SPECIFIC GRAVITY, URINE: 1.024 (ref 1.005–1.030)

## 2017-10-25 LAB — COMPREHENSIVE METABOLIC PANEL
ALT: 26 U/L (ref 14–54)
AST: 38 U/L (ref 15–41)
Albumin: 3.9 g/dL (ref 3.5–5.0)
Alkaline Phosphatase: 51 U/L (ref 38–126)
Anion gap: 14 (ref 5–15)
BUN: 38 mg/dL — ABNORMAL HIGH (ref 6–20)
CHLORIDE: 107 mmol/L (ref 101–111)
CO2: 22 mmol/L (ref 22–32)
Calcium: 9.2 mg/dL (ref 8.9–10.3)
Creatinine, Ser: 1.32 mg/dL — ABNORMAL HIGH (ref 0.44–1.00)
GFR, EST AFRICAN AMERICAN: 39 mL/min — AB (ref >60)
GFR, EST NON AFRICAN AMERICAN: 33 mL/min — AB (ref >60)
Glucose, Bld: 142 mg/dL — ABNORMAL HIGH (ref 65–99)
POTASSIUM: 4.4 mmol/L (ref 3.5–5.1)
Sodium: 143 mmol/L (ref 135–145)
Total Bilirubin: 1.3 mg/dL — ABNORMAL HIGH (ref 0.3–1.2)
Total Protein: 7.5 g/dL (ref 6.5–8.1)

## 2017-10-25 LAB — INFLUENZA PANEL BY PCR (TYPE A & B)
INFLAPCR: NEGATIVE
INFLBPCR: NEGATIVE

## 2017-10-25 MED ORDER — SODIUM CHLORIDE 0.9 % IV BOLUS (SEPSIS)
1000.0000 mL | Freq: Once | INTRAVENOUS | Status: AC
  Administered 2017-10-25: 1000 mL via INTRAVENOUS

## 2017-10-25 MED ORDER — LORAZEPAM 2 MG/ML IJ SOLN
1.0000 mg | Freq: Once | INTRAMUSCULAR | Status: AC
  Administered 2017-10-25: 1 mg via INTRAMUSCULAR

## 2017-10-25 MED ORDER — LORAZEPAM 2 MG/ML IJ SOLN
INTRAMUSCULAR | Status: AC
  Administered 2017-10-25: 1 mg via INTRAMUSCULAR
  Filled 2017-10-25: qty 1

## 2017-10-25 MED ORDER — DEXTROSE 5 % IV SOLN
1.0000 g | Freq: Once | INTRAVENOUS | Status: AC
  Administered 2017-10-25: 1 g via INTRAVENOUS
  Filled 2017-10-25: qty 10

## 2017-10-25 NOTE — ED Notes (Signed)
CT notified pt is calm at this time and sitter is at bedside for safety.

## 2017-10-25 NOTE — ED Provider Notes (Signed)
[facility] Emergency Department Provider Note  ____________________________________________  Time seen: Approximately 7:41 PM  I have reviewed the triage vital signs and the nursing notes.   HISTORY  Chief Complaint Fall; Emesis; and Altered Mental Status  Level 5 caveat:  Portions of the history and physical were unable to be obtained due to dementia   HPI Megan Arnold is a 84 y.o. female with a history of Alzheimer's and vascular dementia, diabetes, CVA, hypertension who presents from her skilled nursing facility for altered mental status. According to EMS patient has been having nausea, vomiting, and diarrhea for a few days.Had a witnessed fall today while trying to walk with her walker at 5 PM. She did not hit her head but she is on Plavix. Patient had a skin tear in her right arm. Patient is screaming and yelling, spitting on the floor, saying that her abdomen hurts and that she has diarrhea. No fevers. Patient denies cough, SOB, CP. Patient is agitated, screaming but answers to questions appropriately.  Past Medical History:  Diagnosis Date  . Altered mental status   . Cerebral infarction (HCC)   . Cognitive communication deficit   . Dementia   . Diabetes mellitus without complication (HCC)   . Hypertension   . Repeated falls   . Unspecified atrial fibrillation [facility])     Patient Active Problem List   Diagnosis Date Noted  . CVA (cerebrovascular accident) (HCC) 09/17/2017  . Mixed Alzheimer's and vascular dementia 09/17/2017  . HTN (hypertension) 09/17/2017  . Diabetes (HCC) 09/17/2017  . CVA (cerebral vascular accident) (HCC) 09/17/2017  . DIABETES MELLITUS, TYPE II 07/11/2006  . HYPERLIPIDEMIA 07/11/2006  . HYPERTENSION 07/11/2006  . VARICOSE VEINS, LOWER EXTREMITIES 07/11/2006  . VAGINITIS, ATROPHIC 07/11/2006  . OSTEOPENIA 07/11/2006  . HX, PERSONAL, PAST NONCOMPLIANCE 07/11/2006    No past surgical history on file.  Prior  to Admission medications   Medication Sig Start Date End Date Taking? Authorizing Provider  acetaminophen (TYLENOL) 325 MG tablet Take 2 tablets (650 mg total) by mouth every 6 (six) hours as needed for mild pain (or Fever >/= 101). 09/20/17  Yes Wieting, Richard, MD  aspirin EC 81 MG tablet Take 81 mg by mouth daily.   Yes [provider]  atorvastatin (LIPITOR) 40 MG tablet Take 1 tablet (40 mg total) by mouth daily at 6 PM. 09/20/17  Yes Wieting, Richard, MD  bisacodyl (DULCOLAX) 5 MG EC tablet Take 10 mg by mouth daily as needed for moderate constipation.   Yes [provider]  Cyanocobalamin (VITAMIN B-12) 5000 MCG TBDP Take 5,000 Units by mouth daily.   Yes [provider]  docusate sodium (COLACE) 100 MG capsule Take 1 capsule (100 mg total) by mouth 2 (two) times daily. 09/20/17  Yes Wieting, Richard, MD  glimepiride (AMARYL) 2 MG tablet Take 1 tablet (2 mg total) by mouth daily with breakfast. 09/21/17  Yes Wieting, Richard, MD  JANUVIA 25 MG tablet Take 25 mg by mouth daily.  01/29/17  Yes [provider]  lisinopril (PRINIVIL,ZESTRIL) 10 MG tablet Take 1 tablet (10 mg total) by mouth daily. 09/20/17  Yes Wieting, Richard, MD  metoprolol succinate (TOPROL-XL) 25 MG 24 hr tablet Take 1 tablet (25 mg total) by mouth at bedtime. 09/20/17  Yes Wieting, Richard, MD  NAMENDA XR 28 MG CP24 24 hr capsule Take 28 mg by mouth daily. 02/09/17  Yes [provider]  promethazine (PHENERGAN) 25 MG/ML injection Inject 25 mg into  the vein every 6 (six) hours as needed for nausea or vomiting. Up to 2 days   Yes [provider]  Rivaroxaban (XARELTO) 15 MG TABS tablet Take 15 mg by mouth daily.   Yes [provider]  Vitamins-Lipotropics (LIPOFLAVONOID) TABS Take by mouth as directed.   Yes [provider]  clopidogrel (PLAVIX) 75 MG tablet Take 1 tablet (75 mg total) by mouth daily. Patient not taking: Reported on 10/25/2017 09/20/17   Alford HighlandWieting, Richard,  MD  QUEtiapine (SEROQUEL) 25 MG tablet Take 1 tablet (25 mg total) by mouth at bedtime. Patient not taking: Reported on 10/25/2017 09/20/17   Alford HighlandWieting, Richard, MD    Allergies Penicillins  No family history on file.  Social History Social History   Tobacco Use  . Smoking status: Never Smoker  . Smokeless tobacco: Never Used  Substance Use Topics  . Alcohol use: No  . Drug use: Not on file    Review of Systems  Constitutional: Negative for fever. + confusion Eyes: Negative for visual changes. ENT: Negative for sore throat. Neck: No neck pain  Cardiovascular: Negative for chest pain. Respiratory: Negative for shortness of breath. Gastrointestinal: Negative for abdominal pain. + vomiting and diarrhea. Genitourinary: Negative for dysuria. Musculoskeletal: Negative for back pain. Skin: Negative for rash. + R forearm skin tear Neurological: Negative for headaches, weakness or numbness. Psych: No SI or HI  ____________________________________________   PHYSICAL EXAM:  VITAL SIGNS: ED Triage Vitals  Enc Vitals Group     BP      Pulse      Resp      Temp      Temp src      SpO2      Weight      Height      Head Circumference      Peak Flow      Pain Score      Pain Loc      Pain Edu?      Excl. in GC?      Constitutional: Alert, screaming, spitting, asking to go to the bathroom HEENT Head: Normocephalic and atraumatic. Face: No facial bony tenderness. Stable midface Ears: No hemotympanum bilaterally. No Battle sign Eyes: No eye injury. PERRL. No raccoon eyes Nose: Nontender. No epistaxis. No rhinorrhea Mouth/Throat: Mucous membranes are moist. No oropharyngeal blood. No dental injury. Airway patent without stridor. Normal voice. Neck: no C-collar in place. No midline c-spine tenderness.  Cardiovascular: Normal rate, regular rhythm. Normal and symmetric distal pulses are present in all extremities. Pulmonary/Chest: Chest wall is stable and nontender to  palpation/compression. Normal respiratory effort. Breath sounds are normal. No crepitus.  Abdominal: Soft, nontender, non distended. Musculoskeletal: Nontender with normal full range of motion in all extremities. No deformities. No thoracic or lumbar midline spinal tenderness. Pelvis is stable. Skin: Skin is warm, dry and intact. Skin tear on R arm Neurological: face is symmetric. Moves all extremities to command. No gross focal neurologic deficits are appreciated.  Glascow Coma Score: 4 - Opens eyes on own 6 - Follows simple motor commands 4 - Seems confused, disoriented GCS: 14  ____________________________________________   LABS (all labs ordered are listed, but only abnormal results are displayed)  Labs Reviewed  CBC WITH DIFFERENTIAL/PLATELET - Abnormal; Notable for the following components:      Result Value   WBC 15.6 (*)    Neutro Abs 12.1 (*)    Monocytes Absolute 1.2 (*)    All other components within normal limits  COMPREHENSIVE METABOLIC PANEL - Abnormal; Notable for the following components:   Glucose, Bld 142 (*)    BUN 38 (*)    Creatinine, Ser 1.32 (*)    Total Bilirubin 1.3 (*)    GFR calc non Af Amer 33 (*)    GFR calc Af Amer 39 (*)    All other components within normal limits  URINALYSIS, COMPLETE (UACMP) WITH MICROSCOPIC - Abnormal; Notable for the following components:   Color, Urine YELLOW (*)    APPearance CLOUDY (*)    Ketones, ur 20 (*)    Leukocytes, UA MODERATE (*)    Squamous Epithelial / LPF 0-5 (*)    All other components within normal limits  C DIFFICILE QUICK SCREEN W PCR REFLEX  URINE CULTURE  INFLUENZA PANEL BY PCR (TYPE A & B)   ____________________________________________  EKG  ED ECG REPORT I, Nita Sickle, the attending physician, personally viewed and interpreted this ECG.  Atrial fibrillation, rate of 111, normal QTC, normal axis, no ST elevations or depressions. Unchanged from  prior ____________________________________________  RADIOLOGY  Interpreted by me: XR R arm: Negative  CT head and cspine: Negative   Interpretation by Radiologist:  Dg Forearm Right  Result Date: 10/25/2017 CLINICAL DATA:  Witnessed fall EXAM: RIGHT FOREARM - 2 VIEW COMPARISON:  None. FINDINGS: Subjective osteopenia. No fracture or malalignment. Degenerative changes at the distal radiocarpal and ulnar carpal joints. No significant elbow effusion IMPRESSION: Negative. Electronically Signed   By: Jasmine Pang M.D.   On: 10/25/2017 21:56   Ct Head Wo Contrast  Result Date: 10/25/2017 CLINICAL DATA:  Witnessed fall, history dementia, additionally has had nausea, vomiting and diarrhea for the last few days, history stroke, hypertension, diabetes mellitus EXAM: CT HEAD WITHOUT CONTRAST CT CERVICAL SPINE WITHOUT CONTRAST TECHNIQUE: Multidetector CT imaging of the head and cervical spine was performed following the standard protocol without intravenous contrast. Multiplanar CT image reconstructions of the cervical spine were also generated. COMPARISON:  09/17/2017 CT head FINDINGS: CT HEAD FINDINGS Brain: Generalized atrophy. Normal ventricular morphology. No midline shift or mass effect. Small vessel chronic ischemic changes of deep cerebral white matter. Old LEFT occipital infarct. No intracranial hemorrhage, mass lesion, evidence of acute infarction, or extra-axial fluid collection. Vascular: Mild atherosclerotic calcification of internal carotid arteries at skull base. Skull: Intact Sinuses/Orbits: Clear Other: N/A CT CERVICAL SPINE FINDINGS Alignment: Minimal anterolisthesis at C4-C5 likely due to facet degenerative changes. Remaining alignments normal Skull base and vertebrae: Osseous demineralization. Visualized skull base intact. Multilevel facet degenerative changes. Degenerative disc disease changes with disc space narrowing and endplate spur formation primarily at C5-C6. Vertebral body heights  maintained without fracture or additional subluxation. No bone destruction. Soft tissues and spinal canal: Prevertebral soft tissues normal thickness. Remaining visualized cervical soft tissues unremarkable. Disc levels:  No additional abnormalities Upper chest: Tips of lung apices clear Other: N/A IMPRESSION: Atrophy with small vessel chronic ischemic changes of deep cerebral white matter. Old LEFT occipital infarct. No acute intracranial abnormalities. Multilevel degenerative disc and facet disease changes of the cervical spine. No acute cervical spine abnormalities. Electronically Signed   By: Ulyses Southward M.D.   On: 10/25/2017 21:49   Ct Cervical Spine Wo Contrast  Result Date: 10/25/2017 CLINICAL DATA:  Witnessed fall, history dementia, additionally has had nausea, vomiting and diarrhea for the last few days, history stroke, hypertension, diabetes mellitus EXAM: CT HEAD WITHOUT CONTRAST CT CERVICAL SPINE WITHOUT CONTRAST TECHNIQUE: Multidetector CT imaging of the head and cervical spine was performed  following the standard protocol without intravenous contrast. Multiplanar CT image reconstructions of the cervical spine were also generated. COMPARISON:  09/17/2017 CT head FINDINGS: CT HEAD FINDINGS Brain: Generalized atrophy. Normal ventricular morphology. No midline shift or mass effect. Small vessel chronic ischemic changes of deep cerebral white matter. Old LEFT occipital infarct. No intracranial hemorrhage, mass lesion, evidence of acute infarction, or extra-axial fluid collection. Vascular: Mild atherosclerotic calcification of internal carotid arteries at skull base. Skull: Intact Sinuses/Orbits: Clear Other: N/A CT CERVICAL SPINE FINDINGS Alignment: Minimal anterolisthesis at C4-C5 likely due to facet degenerative changes. Remaining alignments normal Skull base and vertebrae: Osseous demineralization. Visualized skull base intact. Multilevel facet degenerative changes. Degenerative disc disease changes  with disc space narrowing and endplate spur formation primarily at C5-C6. Vertebral body heights maintained without fracture or additional subluxation. No bone destruction. Soft tissues and spinal canal: Prevertebral soft tissues normal thickness. Remaining visualized cervical soft tissues unremarkable. Disc levels:  No additional abnormalities Upper chest: Tips of lung apices clear Other: N/A IMPRESSION: Atrophy with small vessel chronic ischemic changes of deep cerebral white matter. Old LEFT occipital infarct. No acute intracranial abnormalities. Multilevel degenerative disc and facet disease changes of the cervical spine. No acute cervical spine abnormalities. Electronically Signed   By: Ulyses Southward M.D.   On: 10/25/2017 21:49    ____________________________________________   PROCEDURES  Procedure(s) performed: None Procedures Critical Care performed:  None ____________________________________________   INITIAL IMPRESSION / ASSESSMENT AND PLAN / ED COURSE  84 y.o. female with a history of Alzheimer's and vascular dementia, diabetes, CVA, hypertension who presents from her skilled nursing facility for altered mental status in the setting of 2 -3 days of vomiting and diarrhea. Also had witnessed fall today. Patient is agitated, spitting, screaming but will redirect and answer to questions. Head is atraumatic with no signs or symptoms of basilar skull fracture, patient does have a skin tear on her right upper extremity. Tetanus shots up-to-date. The wound was cleaned and dressed. X-ray of the right forearm, head CT and CT cervical spine ordered. Will check labs to eval for AKI, electrolyte abnormalities, Flu, C. Diff, UTI.    _________________________ 11:24 PM on 10/25/2017 -----------------------------------------  Labs show a mild AKI, UTI, white count of 15. Patient was given Rocephin and IV fluids. Head CT and CT cervical spine with no acute findings. Chest x-ray negative. Flu negative. C.  difficile is pending. We'll admit to the hospitalist service.   As part of my medical decision making, I reviewed the following data within the electronic MEDICAL RECORD NUMBER Nursing notes reviewed and incorporated, Labs reviewed , EKG interpreted , Radiograph reviewed , Discussed with admitting physician , Notes from prior ED visits and [place] Controlled Substance Database    Pertinent labs & imaging results that were available during my care of the patient were reviewed by me and considered in my medical decision making (see chart for details).    ____________________________________________   FINAL CLINICAL IMPRESSION(S) / ED DIAGNOSES  Final diagnoses:  Encephalopathy  AKI (acute kidney injury) (HCC)  Acute cystitis with hematuria  Gastroenteritis  New onset atrial fibrillation (HCC)      NEW MEDICATIONS STARTED DURING THIS VISIT:  ED Discharge Orders    None       Note:  This document was prepared using Dragon voice recognition software and may include unintentional dictation errors.    Don Perking, [place], MD 10/25/17 2325

## 2017-10-25 NOTE — ED Triage Notes (Signed)
Per EMS, pt from [facility] with reports of witnessed fall today at 1700, skin tear to right arm, denies head inj. EMS also reports pt has hx of dementia and has been combative and yelling. Per report from nursing home, pt also has had N/V/D for the last few days. Pt yelling, spitting on floor at this time, EDP in rm.

## 2017-10-25 NOTE — ED Notes (Addendum)
Pt continuing to yell for water, wanting to get out of bed and is a high fall risk. EDP aware, pt given water. Tolerated well. Sarah EDT in rm for safety sitting, see order.

## 2017-10-25 NOTE — ED Notes (Signed)
Pt repeating "I am a yankee, yankee doodle dandee."  Also stating "we northerners helped you people, the southerners." Unable to assess pt's stomach pain due to pt repeating these statements and pointing at this RN.

## 2017-10-25 NOTE — ED Notes (Signed)
Pt yelling and repeating "I need water, give me water" also yelling "I am going to spit." Pt's mouth suctioned again and appears more comfortable. Pt notified she will be given water once verified with EDP.

## 2017-10-25 NOTE — ED Notes (Signed)
Patient transported to CT 

## 2017-10-26 ENCOUNTER — Other Ambulatory Visit: Payer: Self-pay

## 2017-10-26 ENCOUNTER — Encounter: Payer: Self-pay | Admitting: Internal Medicine

## 2017-10-26 DIAGNOSIS — E860 Dehydration: Secondary | ICD-10-CM | POA: Diagnosis present

## 2017-10-26 DIAGNOSIS — N3001 Acute cystitis with hematuria: Secondary | ICD-10-CM | POA: Diagnosis present

## 2017-10-26 DIAGNOSIS — Z79899 Other long term (current) drug therapy: Secondary | ICD-10-CM | POA: Diagnosis not present

## 2017-10-26 DIAGNOSIS — Y9301 Activity, walking, marching and hiking: Secondary | ICD-10-CM | POA: Diagnosis present

## 2017-10-26 DIAGNOSIS — R4182 Altered mental status, unspecified: Secondary | ICD-10-CM | POA: Diagnosis present

## 2017-10-26 DIAGNOSIS — Z880 Allergy status to penicillin: Secondary | ICD-10-CM | POA: Diagnosis not present

## 2017-10-26 DIAGNOSIS — Z7982 Long term (current) use of aspirin: Secondary | ICD-10-CM | POA: Diagnosis not present

## 2017-10-26 DIAGNOSIS — W1830XA Fall on same level, unspecified, initial encounter: Secondary | ICD-10-CM | POA: Diagnosis present

## 2017-10-26 DIAGNOSIS — K529 Noninfective gastroenteritis and colitis, unspecified: Secondary | ICD-10-CM | POA: Diagnosis present

## 2017-10-26 DIAGNOSIS — F0150 Vascular dementia without behavioral disturbance: Secondary | ICD-10-CM | POA: Diagnosis present

## 2017-10-26 DIAGNOSIS — G301 Alzheimer's disease with late onset: Secondary | ICD-10-CM | POA: Diagnosis not present

## 2017-10-26 DIAGNOSIS — Z515 Encounter for palliative care: Secondary | ICD-10-CM | POA: Diagnosis not present

## 2017-10-26 DIAGNOSIS — Z7901 Long term (current) use of anticoagulants: Secondary | ICD-10-CM | POA: Diagnosis not present

## 2017-10-26 DIAGNOSIS — Z66 Do not resuscitate: Secondary | ICD-10-CM | POA: Diagnosis present

## 2017-10-26 DIAGNOSIS — I4891 Unspecified atrial fibrillation: Secondary | ICD-10-CM | POA: Diagnosis present

## 2017-10-26 DIAGNOSIS — F0281 Dementia in other diseases classified elsewhere with behavioral disturbance: Secondary | ICD-10-CM | POA: Diagnosis not present

## 2017-10-26 DIAGNOSIS — R296 Repeated falls: Secondary | ICD-10-CM | POA: Diagnosis present

## 2017-10-26 DIAGNOSIS — M5030 Other cervical disc degeneration, unspecified cervical region: Secondary | ICD-10-CM | POA: Diagnosis present

## 2017-10-26 DIAGNOSIS — N179 Acute kidney failure, unspecified: Secondary | ICD-10-CM | POA: Diagnosis present

## 2017-10-26 DIAGNOSIS — E876 Hypokalemia: Secondary | ICD-10-CM | POA: Diagnosis present

## 2017-10-26 DIAGNOSIS — Y92129 Unspecified place in nursing home as the place of occurrence of the external cause: Secondary | ICD-10-CM | POA: Diagnosis not present

## 2017-10-26 DIAGNOSIS — S41111A Laceration without foreign body of right upper arm, initial encounter: Secondary | ICD-10-CM | POA: Diagnosis present

## 2017-10-26 DIAGNOSIS — Z8673 Personal history of transient ischemic attack (TIA), and cerebral infarction without residual deficits: Secondary | ICD-10-CM | POA: Diagnosis not present

## 2017-10-26 DIAGNOSIS — I10 Essential (primary) hypertension: Secondary | ICD-10-CM | POA: Diagnosis present

## 2017-10-26 DIAGNOSIS — E119 Type 2 diabetes mellitus without complications: Secondary | ICD-10-CM | POA: Diagnosis present

## 2017-10-26 DIAGNOSIS — G309 Alzheimer's disease, unspecified: Secondary | ICD-10-CM | POA: Diagnosis present

## 2017-10-26 DIAGNOSIS — Z9181 History of falling: Secondary | ICD-10-CM | POA: Diagnosis not present

## 2017-10-26 LAB — BASIC METABOLIC PANEL
ANION GAP: 10 (ref 5–15)
BUN: 32 mg/dL — ABNORMAL HIGH (ref 6–20)
CHLORIDE: 112 mmol/L — AB (ref 101–111)
CO2: 20 mmol/L — ABNORMAL LOW (ref 22–32)
CREATININE: 0.97 mg/dL (ref 0.44–1.00)
Calcium: 7.9 mg/dL — ABNORMAL LOW (ref 8.9–10.3)
GFR calc non Af Amer: 48 mL/min — ABNORMAL LOW (ref >60)
GFR, EST AFRICAN AMERICAN: 56 mL/min — AB (ref >60)
Glucose, Bld: 113 mg/dL — ABNORMAL HIGH (ref 65–99)
POTASSIUM: 3.3 mmol/L — AB (ref 3.5–5.1)
SODIUM: 142 mmol/L (ref 135–145)

## 2017-10-26 LAB — GLUCOSE, CAPILLARY
GLUCOSE-CAPILLARY: 137 mg/dL — AB (ref 65–99)
Glucose-Capillary: 82 mg/dL (ref 65–99)
Glucose-Capillary: 53 mg/dL — ABNORMAL LOW (ref 65–99)

## 2017-10-26 LAB — CBC
HEMATOCRIT: 32.2 % — AB (ref 35.0–47.0)
HEMOGLOBIN: 10.7 g/dL — AB (ref 12.0–16.0)
MCH: 28.3 pg (ref 26.0–34.0)
MCHC: 33.1 g/dL (ref 32.0–36.0)
MCV: 85.2 fL (ref 80.0–100.0)
Platelets: 211 10*3/uL (ref 150–440)
RBC: 3.77 MIL/uL — AB (ref 3.80–5.20)
RDW: 15 % — ABNORMAL HIGH (ref 11.5–14.5)
WBC: 9.6 10*3/uL (ref 3.6–11.0)

## 2017-10-26 LAB — GLUCOSE, RANDOM: GLUCOSE: 103 mg/dL — AB (ref 65–99)

## 2017-10-26 LAB — MRSA PCR SCREENING: MRSA by PCR: NEGATIVE

## 2017-10-26 MED ORDER — DOCUSATE SODIUM 100 MG PO CAPS
100.0000 mg | ORAL_CAPSULE | Freq: Two times a day (BID) | ORAL | Status: DC
  Administered 2017-10-26: 10:00:00 100 mg via ORAL
  Filled 2017-10-26: qty 1

## 2017-10-26 MED ORDER — ONDANSETRON HCL 4 MG PO TABS: 4.0000 mg | ORAL_TABLET | Freq: Four times a day (QID) | ORAL | Status: DC | PRN

## 2017-10-26 MED ORDER — LORAZEPAM 2 MG/ML IJ SOLN
1.0000 mg | Freq: Once | INTRAMUSCULAR | Status: AC
  Administered 2017-10-26: 1 mg via INTRAVENOUS
  Filled 2017-10-26: qty 1

## 2017-10-26 MED ORDER — INSULIN ASPART 100 UNIT/ML ~~LOC~~ SOLN: 0.0000 [IU] | Freq: Every day | SUBCUTANEOUS | Status: DC

## 2017-10-26 MED ORDER — LIPOFLAVONOID PO TABS: ORAL_TABLET | ORAL | Status: DC

## 2017-10-26 MED ORDER — LINAGLIPTIN 5 MG PO TABS
5.0000 mg | ORAL_TABLET | Freq: Every day | ORAL | Status: DC
  Administered 2017-10-26: 5 mg via ORAL
  Filled 2017-10-26 (×2): qty 1

## 2017-10-26 MED ORDER — GLIMEPIRIDE 2 MG PO TABS
2.0000 mg | ORAL_TABLET | Freq: Every day | ORAL | Status: DC
  Administered 2017-10-26: 10:00:00 2 mg via ORAL
  Filled 2017-10-26 (×2): qty 1

## 2017-10-26 MED ORDER — METOPROLOL SUCCINATE ER 25 MG PO TB24
25.0000 mg | ORAL_TABLET | Freq: Every day | ORAL | Status: DC
  Administered 2017-10-26: 25 mg via ORAL
  Filled 2017-10-26: qty 1

## 2017-10-26 MED ORDER — ONDANSETRON HCL 4 MG/2ML IJ SOLN
4.0000 mg | Freq: Four times a day (QID) | INTRAMUSCULAR | Status: DC | PRN
Start: 2017-10-26 — End: 2017-10-31
  Administered 2017-10-27: 06:00:00 4 mg via INTRAVENOUS
  Filled 2017-10-26: qty 2

## 2017-10-26 MED ORDER — SODIUM CHLORIDE 0.9 % IV SOLN
INTRAVENOUS | Status: DC
  Administered 2017-10-26 – 2017-10-31 (×8): via INTRAVENOUS

## 2017-10-26 MED ORDER — ASPIRIN EC 81 MG PO TBEC
81.0000 mg | DELAYED_RELEASE_TABLET | Freq: Every day | ORAL | Status: DC
  Administered 2017-10-26 – 2017-10-31 (×5): 81 mg via ORAL
  Filled 2017-10-26 (×6): qty 1

## 2017-10-26 MED ORDER — RIVAROXABAN 15 MG PO TABS
15.0000 mg | ORAL_TABLET | Freq: Every day | ORAL | Status: DC
  Administered 2017-10-26 – 2017-10-30 (×4): 15 mg via ORAL
  Filled 2017-10-26 (×6): qty 1

## 2017-10-26 MED ORDER — ACETAMINOPHEN 325 MG PO TABS: 650.0000 mg | ORAL_TABLET | Freq: Four times a day (QID) | ORAL | Status: DC | PRN

## 2017-10-26 MED ORDER — POTASSIUM CHLORIDE 20 MEQ PO PACK
40.0000 meq | PACK | Freq: Once | ORAL | Status: AC
  Administered 2017-10-26: 40 meq via ORAL
  Filled 2017-10-26: qty 2

## 2017-10-26 MED ORDER — DEXTROSE 50 % IV SOLN
INTRAVENOUS | Status: AC
  Administered 2017-10-26: 22:00:00 25 mL via INTRAVENOUS
  Filled 2017-10-26: qty 50

## 2017-10-26 MED ORDER — ATORVASTATIN CALCIUM 20 MG PO TABS
40.0000 mg | ORAL_TABLET | Freq: Every day | ORAL | Status: DC
  Administered 2017-10-26 – 2017-10-30 (×2): 40 mg via ORAL
  Filled 2017-10-26 (×2): qty 2

## 2017-10-26 MED ORDER — INSULIN ASPART 100 UNIT/ML ~~LOC~~ SOLN
0.0000 [IU] | Freq: Three times a day (TID) | SUBCUTANEOUS | Status: DC
  Administered 2017-10-29: 1 [IU] via SUBCUTANEOUS
  Administered 2017-10-30: 2 [IU] via SUBCUTANEOUS
  Administered 2017-10-31: 1 [IU] via SUBCUTANEOUS
  Filled 2017-10-26 (×3): qty 1

## 2017-10-26 MED ORDER — DEXTROSE 5 % IV SOLN
2.0000 g | INTRAVENOUS | Status: DC
  Administered 2017-10-26: 2 g via INTRAVENOUS
  Filled 2017-10-26 (×2): qty 2

## 2017-10-26 MED ORDER — SENNOSIDES-DOCUSATE SODIUM 8.6-50 MG PO TABS
1.0000 | ORAL_TABLET | Freq: Every evening | ORAL | Status: DC | PRN
Start: 2017-10-26 — End: 2017-10-31

## 2017-10-26 MED ORDER — MEMANTINE HCL ER 28 MG PO CP24
28.0000 mg | ORAL_CAPSULE | Freq: Every day | ORAL | Status: DC
  Administered 2017-10-26 – 2017-10-31 (×5): 28 mg via ORAL
  Filled 2017-10-26 (×6): qty 1

## 2017-10-26 MED ORDER — DOCUSATE SODIUM 100 MG PO CAPS: 100.0000 mg | ORAL_CAPSULE | Freq: Every day | ORAL | Status: DC | PRN

## 2017-10-26 MED ORDER — LORAZEPAM 2 MG/ML IJ SOLN
0.5000 mg | Freq: Four times a day (QID) | INTRAMUSCULAR | Status: DC | PRN
  Administered 2017-10-26: 18:00:00 0.5 mg via INTRAVENOUS
  Administered 2017-10-27 (×2): 1 mg via INTRAVENOUS
  Administered 2017-10-27: 0.5 mg via INTRAVENOUS
  Administered 2017-10-28: 1 mg via INTRAVENOUS
  Filled 2017-10-26 (×6): qty 1

## 2017-10-26 MED ORDER — VITAMIN B-12 1000 MCG PO TABS
5000.0000 ug | ORAL_TABLET | Freq: Every day | ORAL | Status: DC
  Administered 2017-10-26 – 2017-10-31 (×5): 5000 ug via ORAL
  Filled 2017-10-26 (×6): qty 5

## 2017-10-26 MED ORDER — ACETAMINOPHEN 650 MG RE SUPP: 650.0000 mg | Freq: Four times a day (QID) | RECTAL | Status: DC | PRN

## 2017-10-26 MED ORDER — LISINOPRIL 10 MG PO TABS
10.0000 mg | ORAL_TABLET | Freq: Every day | ORAL | Status: DC
Start: 2017-10-26 — End: 2017-10-31
  Administered 2017-10-26 – 2017-10-31 (×5): 10 mg via ORAL
  Filled 2017-10-26 (×6): qty 1

## 2017-10-26 MED ORDER — DEXTROSE 50 % IV SOLN
25.0000 mL | Freq: Once | INTRAVENOUS | Status: AC
  Administered 2017-10-26: 25 mL via INTRAVENOUS

## 2017-10-26 NOTE — Clinical Social Work Note (Signed)
CSW attempted to call patient's family to confirm discharge plan to return back to [facility], left a message awaiting for call back from patient's family.  Per [facility] patient is a long term care resident and can return once she is medically ready for discharge.  Ervin KnackEric R. Adelei Scobey, MSW, Theresia MajorsLCSWA [phone]  10/26/2017 6:52 PM

## 2017-10-26 NOTE — Progress Notes (Signed)
Hypoglycemic Event  CBG: 53  Treatment: 1/2 amp D50 IVP  Symptoms: none Follow-up CBG: Time:2210 CBG Result: 137 Gave pt 1/2 carton of milk and bites of peanut butter.  Will continue to encourage eating.  Possible Reasons for Event:Pt did not eat lunch or dinner.    Comments/MD notified:No.  Patient responded to treatment per  protocol.    Peterson LombardSarah C Larenzo Caples RN

## 2017-10-26 NOTE — NC FL2 (Signed)
[place] MEDICAID FL2 LEVEL OF CARE SCREENING TOOL     IDENTIFICATION  Patient Name: Megan Arnold Birthdate: 1921/07/23 Sex: female Admission Date (Current Location): 10/25/2017  [place] and [place] Number:  [address] [id number] Facility and Address:  [facility], [address] [place], [place] [address]      Provider Number: [id number]  Attending Physician Name and Address:  Enid Baas, MD  Relative Name and Phone Number:  Ladaisha, [place] Daughter [phone]  or Michelle Nasuti   [phone]     Current Level of Care: Hospital Recommended Level of Care: Skilled Nursing Facility Prior Approval Number:    Date Approved/Denied:   PASRR Number: [id number]  Discharge Plan: SNF    Current Diagnoses: Patient Active Problem List   Diagnosis Date Noted  . Gastroenteritis 10/26/2017  . CVA (cerebrovascular accident) (HCC) 09/17/2017  . Mixed Alzheimer's and vascular dementia 09/17/2017  . HTN (hypertension) 09/17/2017  . Diabetes (HCC) 09/17/2017  . CVA (cerebral vascular accident) (HCC) 09/17/2017  . DIABETES MELLITUS, TYPE II 07/11/2006  . HYPERLIPIDEMIA 07/11/2006  . HYPERTENSION 07/11/2006  . VARICOSE VEINS, LOWER EXTREMITIES 07/11/2006  . VAGINITIS, ATROPHIC 07/11/2006  . OSTEOPENIA 07/11/2006  . HX, PERSONAL, PAST NONCOMPLIANCE 07/11/2006    Orientation RESPIRATION BLADDER Height & Weight     Self  Normal Incontinent Weight: 125 lb (56.7 kg) Height:  5\' 2"  (157.5 cm)  BEHAVIORAL SYMPTOMS/MOOD NEUROLOGICAL BOWEL NUTRITION STATUS      Continent Diet(Carb Modified)  AMBULATORY STATUS COMMUNICATION OF NEEDS Skin   Limited Assist Verbally Normal                       Personal Care Assistance Level of Assistance  Bathing, Feeding, Dressing Bathing Assistance: Limited assistance Feeding assistance: Limited assistance Dressing Assistance: Limited assistance     Functional Limitations Info  Sight, Hearing,  Speech Sight Info: Adequate Hearing Info: Adequate Speech Info: Adequate    SPECIAL CARE FACTORS FREQUENCY                       Contractures Contractures Info: Not present    Additional Factors Info  Insulin Sliding Scale, Allergies, Code Status Code Status Info: Full Code Allergies Info: Penicillins   Insulin Sliding Scale Info: insulin aspart (novoLOG) injection 0-5 Units 3x a day with meals       Current Medications (10/26/2017):  This is the current hospital active medication list Current Facility-Administered Medications  Medication Dose Route Frequency Provider Last Rate Last Dose  . 0.9 %  sodium chloride infusion   Intravenous Continuous Ihor Austin, MD 75 mL/hr at 10/26/17 1723    . acetaminophen (TYLENOL) tablet 650 mg  650 mg Oral Q6H PRN Ihor Austin, MD       Or  . acetaminophen (TYLENOL) suppository 650 mg  650 mg Rectal Q6H PRN Pyreddy, Vivien Rota, MD      . aspirin EC tablet 81 mg  81 mg Oral Daily Pyreddy, Pavan, MD   81 mg at 10/26/17 1012  . atorvastatin (LIPITOR) tablet 40 mg  40 mg Oral q1800 Ihor Austin, MD   40 mg at 10/26/17 1723  . cefTRIAXone (ROCEPHIN) 2 g in dextrose 5 % 50 mL IVPB  2 g Intravenous Q24H Pyreddy, Pavan, MD      . docusate sodium (COLACE) capsule 100 mg  100 mg Oral Daily PRN Enid Baas, MD      . glimepiride (AMARYL) tablet 2 mg  2 mg  Oral Q breakfast Ihor AustinPyreddy, Pavan, MD   2 mg at 10/26/17 1013  . insulin aspart (novoLOG) injection 0-5 Units  0-5 Units Subcutaneous QHS Enid BaasKalisetti, Radhika, MD      . insulin aspart (novoLOG) injection 0-9 Units  0-9 Units Subcutaneous TID WC Enid BaasKalisetti, Radhika, MD      . linagliptin (TRADJENTA) tablet 5 mg  5 mg Oral Daily Pyreddy, Pavan, MD   5 mg at 10/26/17 1013  . lisinopril (PRINIVIL,ZESTRIL) tablet 10 mg  10 mg Oral Daily Pyreddy, Vivien RotaPavan, MD   10 mg at 10/26/17 1012  . LORazepam (ATIVAN) injection 0.5-1 mg  0.5-1 mg Intravenous Q6H PRN Enid BaasKalisetti, Radhika, MD   0.5 mg at 10/26/17 1736   . memantine (NAMENDA XR) 24 hr capsule 28 mg  28 mg Oral Daily Pyreddy, Pavan, MD   28 mg at 10/26/17 1012  . metoprolol succinate (TOPROL-XL) 24 hr tablet 25 mg  25 mg Oral QHS Pyreddy, Pavan, MD      . ondansetron (ZOFRAN) tablet 4 mg  4 mg Oral Q6H PRN Pyreddy, Vivien RotaPavan, MD       Or  . ondansetron (ZOFRAN) injection 4 mg  4 mg Intravenous Q6H PRN Pyreddy, Pavan, MD      . Rivaroxaban (XARELTO) tablet 15 mg  15 mg Oral Daily Pyreddy, Pavan, MD   15 mg at 10/26/17 1013  . senna-docusate (Senokot-S) tablet 1 tablet  1 tablet Oral QHS PRN Pyreddy, Vivien RotaPavan, MD      . vitamin B-12 (CYANOCOBALAMIN) tablet 5,000 mcg  5,000 mcg Oral Daily Pyreddy, Vivien RotaPavan, MD   5,000 mcg at 10/26/17 1012     Discharge Medications: Please see discharge summary for a list of discharge medications.  Relevant Imaging Results:  Relevant Lab Results:   Additional Information  SSN [id number]  Darleene Cleavernterhaus, Marcha Licklider R, [place]

## 2017-10-26 NOTE — ED Notes (Signed)
Per floor RN, pt can be transported to rm at this time.

## 2017-10-26 NOTE — Progress Notes (Addendum)
[employer] [facility] [place] at [facility]   PATIENT NAME: Megan Arnold    MR#:  [id number]  DATE OF BIRTH:  07/05/1921  SUBJECTIVE:  CHIEF COMPLAINT:   Chief Complaint  Patient presents with  . Fall  . Emesis  . Altered Mental Status   - Came in after an unwitnessed fall. Significantly confused and irritable. -Has known dementia. Receiving IV fluids for dehydration.  REVIEW OF SYSTEMS:  Review of Systems  Unable to perform ROS: Dementia    DRUG ALLERGIES:   Allergies  Allergen Reactions  . Penicillins     Has patient had a PCN reaction causing immediate rash, facial/tongue/throat swelling, SOB or lightheadedness with hypotension: Yes Has patient had a PCN reaction causing severe rash involving mucus membranes or skin necrosis: Yes Has patient had a PCN reaction that required hospitalization: Yes Has patient had a PCN reaction occurring within the last 10 years: No If all of the above answers are "NO", then may proceed with Cephalosporin use.     VITALS:  Blood pressure 129/76, pulse 82, temperature (!) 97.5 F (36.4 C), temperature source Oral, resp. rate 18, height 5\' 2"  (1.575 m), weight 56.7 kg (125 lb), SpO2 96 %.  PHYSICAL EXAMINATION:  Physical Exam  GENERAL:  84 y.o.-year-old patient lying in the bed with no acute distress.  EYES: Pupils equal, round, reactive to light and accommodation. No scleral icterus. Extraocular muscles intact.  HEENT: Head atraumatic, normocephalic. Oropharynx and nasopharynx clear.  NECK:  Supple, no jugular venous distention. No thyroid enlargement, no tenderness.  LUNGS: Normal breath sounds bilaterally, no wheezing, rales,rhonchi or crepitation. No use of accessory muscles of respiration.  CARDIOVASCULAR: S1, S2 normal. No  rubs, or gallops. 2/6 systolic murmur present ABDOMEN: Soft, nontender, nondistended. Bowel sounds present. No organomegaly or mass.  EXTREMITIES: No pedal edema, cyanosis, or clubbing.    NEUROLOGIC: Cranial nerves II through XII are intact. Muscle strength 5/5 in all extremities. Sensation intact. Gait not checked. Global weakness noted. PSYCHIATRIC: The patient is alert and oriented to self, very irritable at times.  SKIN: No obvious rash, lesion, or ulcer.    LABORATORY PANEL:   CBC Recent Labs  Lab 10/26/17 0423  WBC 9.6  HGB 10.7*  HCT 32.2*  PLT 211   ------------------------------------------------------------------------------------------------------------------  Chemistries  Recent Labs  Lab 10/25/17 2100 10/26/17 0423  NA 143 142  K 4.4 3.3*  CL 107 112*  CO2 22 20*  GLUCOSE 142* 113*  BUN 38* 32*  CREATININE 1.32* 0.97  CALCIUM 9.2 7.9*  AST 38  --   ALT 26  --   ALKPHOS 51  --   BILITOT 1.3*  --    ------------------------------------------------------------------------------------------------------------------  Cardiac Enzymes No results for input(s): TROPONINI in the last 168 hours. ------------------------------------------------------------------------------------------------------------------  RADIOLOGY:  Dg Forearm Right  Result Date: 10/25/2017 CLINICAL DATA:  Witnessed fall EXAM: RIGHT FOREARM - 2 VIEW COMPARISON:  None. FINDINGS: Subjective osteopenia. No fracture or malalignment. Degenerative changes at the distal radiocarpal and ulnar carpal joints. No significant elbow effusion IMPRESSION: Negative. Electronically Signed   By: Jasmine PangKim  Fujinaga M.D.   On: 10/25/2017 21:56   Ct Head Wo Contrast  Result Date: 10/25/2017 CLINICAL DATA:  Witnessed fall, history dementia, additionally has had nausea, vomiting and diarrhea for the last few days, history stroke, hypertension, diabetes mellitus EXAM: CT HEAD WITHOUT CONTRAST CT CERVICAL SPINE WITHOUT CONTRAST TECHNIQUE: Multidetector CT imaging of the head and cervical spine was performed following the standard protocol without  intravenous contrast. Multiplanar CT image reconstructions of  the cervical spine were also generated. COMPARISON:  09/17/2017 CT head FINDINGS: CT HEAD FINDINGS Brain: Generalized atrophy. Normal ventricular morphology. No midline shift or mass effect. Small vessel chronic ischemic changes of deep cerebral white matter. Old LEFT occipital infarct. No intracranial hemorrhage, mass lesion, evidence of acute infarction, or extra-axial fluid collection. Vascular: Mild atherosclerotic calcification of internal carotid arteries at skull base. Skull: Intact Sinuses/Orbits: Clear Other: N/A CT CERVICAL SPINE FINDINGS Alignment: Minimal anterolisthesis at C4-C5 likely due to facet degenerative changes. Remaining alignments normal Skull base and vertebrae: Osseous demineralization. Visualized skull base intact. Multilevel facet degenerative changes. Degenerative disc disease changes with disc space narrowing and endplate spur formation primarily at C5-C6. Vertebral body heights maintained without fracture or additional subluxation. No bone destruction. Soft tissues and spinal canal: Prevertebral soft tissues normal thickness. Remaining visualized cervical soft tissues unremarkable. Disc levels:  No additional abnormalities Upper chest: Tips of lung apices clear Other: N/A IMPRESSION: Atrophy with small vessel chronic ischemic changes of deep cerebral white matter. Old LEFT occipital infarct. No acute intracranial abnormalities. Multilevel degenerative disc and facet disease changes of the cervical spine. No acute cervical spine abnormalities. Electronically Signed   By: Ulyses Southward M.D.   On: 10/25/2017 21:49   Ct Cervical Spine Wo Contrast  Result Date: 10/25/2017 CLINICAL DATA:  Witnessed fall, history dementia, additionally has had nausea, vomiting and diarrhea for the last few days, history stroke, hypertension, diabetes mellitus EXAM: CT HEAD WITHOUT CONTRAST CT CERVICAL SPINE WITHOUT CONTRAST TECHNIQUE: Multidetector CT imaging of the head and cervical spine was performed  following the standard protocol without intravenous contrast. Multiplanar CT image reconstructions of the cervical spine were also generated. COMPARISON:  09/17/2017 CT head FINDINGS: CT HEAD FINDINGS Brain: Generalized atrophy. Normal ventricular morphology. No midline shift or mass effect. Small vessel chronic ischemic changes of deep cerebral white matter. Old LEFT occipital infarct. No intracranial hemorrhage, mass lesion, evidence of acute infarction, or extra-axial fluid collection. Vascular: Mild atherosclerotic calcification of internal carotid arteries at skull base. Skull: Intact Sinuses/Orbits: Clear Other: N/A CT CERVICAL SPINE FINDINGS Alignment: Minimal anterolisthesis at C4-C5 likely due to facet degenerative changes. Remaining alignments normal Skull base and vertebrae: Osseous demineralization. Visualized skull base intact. Multilevel facet degenerative changes. Degenerative disc disease changes with disc space narrowing and endplate spur formation primarily at C5-C6. Vertebral body heights maintained without fracture or additional subluxation. No bone destruction. Soft tissues and spinal canal: Prevertebral soft tissues normal thickness. Remaining visualized cervical soft tissues unremarkable. Disc levels:  No additional abnormalities Upper chest: Tips of lung apices clear Other: N/A IMPRESSION: Atrophy with small vessel chronic ischemic changes of deep cerebral white matter. Old LEFT occipital infarct. No acute intracranial abnormalities. Multilevel degenerative disc and facet disease changes of the cervical spine. No acute cervical spine abnormalities. Electronically Signed   By: Ulyses Southward M.D.   On: 10/25/2017 21:49    EKG:   Orders placed or performed during the hospital encounter of 10/25/17  . ED EKG  . ED EKG  . EKG 12-Lead  . EKG 12-Lead    ASSESSMENT AND PLAN:   84 year old female with known history of dementia, hypertension, atrial fibrillation, diabetes mellitus was  brought in from assisted living facility secondary to fall and confusion  1. Fall and confusion-likely due to gait issues. -Urine with only rare bacteria noted. Currently on Rocephin. Receiving fluids. -Physical therapy consult pending. No other source of infection identified. -CT of the  head showing an old stroke.  2. Acute renal failure-secondary to dehydration. Receiving IV fluids with significant improvement  3. Hypokalemia-replaced appropriately  4. Diabetes mellitus-on Amaryl and tradjenta. -follow-up sugars, start on sliding scale insulin  5. Recent stroke-no deficits noted. Continue statin and aspirin  6. History of atrial fibrillation-rate controlled. On metoprolol and also on Xarelto for anticoagulation.   Daughter updated over the phone.  All the records are reviewed and case discussed with Care Management/Social Workerr. Management plans discussed with the patient, family and they are in agreement.  CODE STATUS: Full Code  TOTAL TIME TAKING CARE OF THIS PATIENT: 37 minutes.   POSSIBLE D/C IN 1-2 DAYS, DEPENDING ON CLINICAL CONDITION.   Braleigh Massoud M.D on 10/26/2017 at 12:48 PM  Between 7am to 6pm - Pager - [phone]  After 6pm go to [email or web address] - Social research officer, government  [facility]  Office  [phone]  CC: Primary care physician; Yetta Barre (Inactive)

## 2017-10-26 NOTE — Progress Notes (Signed)
Pharmacy Antibiotic Note  Megan DakinMaxine H Arnold is a 84 y.o. female admitted on 10/25/2017 with UTI.  Pharmacy has been consulted for ceftriaxone dosing.  Plan: Ceftriaxone 2 grams q 24 hours ordered.  Height: 5\' 2"  (157.5 cm) Weight: 125 lb (56.7 kg) IBW/kg (Calculated) : 50.1  Temp (24hrs), Avg:97.7 F (36.5 C), Min:97.7 F (36.5 C), Max:97.7 F (36.5 C)  Recent Labs  Lab 10/25/17 2100  WBC 15.6*  CREATININE 1.32*    Estimated Creatinine Clearance: 20.6 mL/min (A) (by C-G formula based on SCr of 1.32 mg/dL (H)).    Allergies  Allergen Reactions  . Penicillins     Has patient had a PCN reaction causing immediate rash, facial/tongue/throat swelling, SOB or lightheadedness with hypotension: Yes Has patient had a PCN reaction causing severe rash involving mucus membranes or skin necrosis: Yes Has patient had a PCN reaction that required hospitalization: Yes Has patient had a PCN reaction occurring within the last 10 years: No If all of the above answers are "NO", then may proceed with Cephalosporin use.     Antimicrobials this admission: Ceftriaxone 2/5  >>    >>   Dose adjustments this admission:   Microbiology results: 2/5 UCx: pending       2/5 UA: LE(+) NO2(-)  WBC 6-30 Thank you for allowing pharmacy to be a part of this patient's care.  Chanequa Spees S 10/26/2017 1:46 AM

## 2017-10-26 NOTE — Progress Notes (Signed)
PHARMACIST - PHYSICIAN ORDER COMMUNICATION  CONCERNING: P&T Medication Policy on Herbal Medications  DESCRIPTION:  This patient's order for:  lipoflavonoid  has been noted.  This product(s) is classified as an "herbal" or natural product. Due to a lack of definitive safety studies or FDA approval, nonstandard manufacturing practices, plus the potential risk of unknown drug-drug interactions while on inpatient medications, the Pharmacy and Therapeutics Committee does not permit the use of "herbal" or natural products of this type within [facility].   ACTION TAKEN: The pharmacy department is unable to verify this order at this time. Please reevaluate patient's clinical condition at discharge and address if the herbal or natural product(s) should be resumed at that time.

## 2017-10-26 NOTE — H&P (Signed)
[facility] - [place] at [facility]   PATIENT NAME: Megan Arnold    MR#:  [id number]  DATE OF BIRTH:  1920-12-21  DATE OF ADMISSION:  10/25/2017  PRIMARY CARE PHYSICIAN: Yetta Barre (Inactive)   REQUESTING/REFERRING PHYSICIAN:   CHIEF COMPLAINT:   Chief Complaint  Patient presents with  . Fall  . Emesis  . Altered Mental Status    HISTORY OF PRESENT ILLNESS: Megan Arnold  is a 84 y.o. female with a known history of Alzheimer's dementia diabetes mellitus, hypertension, atrial fibrillation is a resident of [facility].  Patient was referred for fall and confusion.  Patient had a witnessed fall yesterday while trying to walk with her walker at around 5 PM.  No history of any head injury.  She has a skin tear in her right arm.  Patient also had nausea vomiting and diarrhea for the last few days according to the facility notes.  She has been screaming yelling and spitting on the floor and trying to get out of bed in the emergency room.  Patient was given IV Ativan to calm her down and patient currently is sleepy and sedated.  Was worked up with CT head which showed old left occipital infarct and no acute abnormalities.  CT cervical spine showed degenerative disc disease.  PAST MEDICAL HISTORY:   Past Medical History:  Diagnosis Date  . Altered mental status   . Cerebral infarction (HCC)   . Cognitive communication deficit   . Dementia   . Diabetes mellitus without complication (HCC)   . Hypertension   . Repeated falls   . Unspecified atrial fibrillation (HCC)     PAST SURGICAL HISTORY:  Past Surgical History:  Procedure Laterality Date  . none      SOCIAL HISTORY:  Social History   Tobacco Use  . Smoking status: Never Smoker  . Smokeless tobacco: Never Used  Substance Use Topics  . Alcohol use: No    FAMILY HISTORY: No family history on file.  DRUG ALLERGIES:  Allergies  Allergen Reactions  . Penicillins     Has patient had a  PCN reaction causing immediate rash, facial/tongue/throat swelling, SOB or lightheadedness with hypotension: Yes Has patient had a PCN reaction causing severe rash involving mucus membranes or skin necrosis: Yes Has patient had a PCN reaction that required hospitalization: Yes Has patient had a PCN reaction occurring within the last 10 years: No If all of the above answers are "NO", then may proceed with Cephalosporin use.     REVIEW OF SYSTEMS:  Could not be obtained as patient is lethargic and sedated after being given Ativan. MEDICATIONS AT HOME:  Prior to Admission medications   Medication Sig Start Date End Date Taking? Authorizing Provider  acetaminophen (TYLENOL) 325 MG tablet Take 2 tablets (650 mg total) by mouth every 6 (six) hours as needed for mild pain (or Fever >/= 101). 09/20/17  Yes Wieting, Richard, MD  aspirin EC 81 MG tablet Take 81 mg by mouth daily.   Yes [provider]  atorvastatin (LIPITOR) 40 MG tablet Take 1 tablet (40 mg total) by mouth daily at 6 PM. 09/20/17  Yes Wieting, Richard, MD  bisacodyl (DULCOLAX) 5 MG EC tablet Take 10 mg by mouth daily as needed for moderate constipation.   Yes [provider]  Cyanocobalamin (VITAMIN B-12) 5000 MCG TBDP Take 5,000 Units by mouth daily.   Yes [provider]  docusate sodium (COLACE) 100 MG capsule Take 1 capsule (  100 mg total) by mouth 2 (two) times daily. 09/20/17  Yes Wieting, Richard, MD  glimepiride (AMARYL) 2 MG tablet Take 1 tablet (2 mg total) by mouth daily with breakfast. 09/21/17  Yes Wieting, Richard, MD  JANUVIA 25 MG tablet Take 25 mg by mouth daily.  01/29/17  Yes [provider]  lisinopril (PRINIVIL,ZESTRIL) 10 MG tablet Take 1 tablet (10 mg total) by mouth daily. 09/20/17  Yes Wieting, Richard, MD  metoprolol succinate (TOPROL-XL) 25 MG 24 hr tablet Take 1 tablet (25 mg total) by mouth at bedtime. 09/20/17  Yes Wieting, Richard, MD  NAMENDA XR 28 MG CP24 24 hr capsule Take 28 mg  by mouth daily. 02/09/17  Yes [provider]  promethazine (PHENERGAN) 25 MG/ML injection Inject 25 mg into the vein every 6 (six) hours as needed for nausea or vomiting. Up to 2 days   Yes [provider]  Rivaroxaban (XARELTO) 15 MG TABS tablet Take 15 mg by mouth daily.   Yes [provider]  Vitamins-Lipotropics (LIPOFLAVONOID) TABS Take by mouth as directed.   Yes [provider]  clopidogrel (PLAVIX) 75 MG tablet Take 1 tablet (75 mg total) by mouth daily. Patient not taking: Reported on 10/25/2017 09/20/17   Alford Highland, MD  QUEtiapine (SEROQUEL) 25 MG tablet Take 1 tablet (25 mg total) by mouth at bedtime. Patient not taking: Reported on 10/25/2017 09/20/17   Alford Highland, MD      PHYSICAL EXAMINATION:   VITAL SIGNS: Blood pressure 122/86, pulse 83, temperature 97.7 F (36.5 C), temperature source Oral, resp. rate 19, height 5\' 2"  (1.575 m), weight 56.7 kg (125 lb), SpO2 98 %.  GENERAL:  84 y.o.-year-old patient lying in the bed with no acute distress.  EYES: Pupils equal, round, reactive to light and accommodation. No scleral icterus. Extraocular muscles intact.  HEENT: Head atraumatic, normocephalic. Oropharynx dry and nasopharynx clear.  NECK:  Supple, no jugular venous distention. No thyroid enlargement, no tenderness.  LUNGS: Normal breath sounds bilaterally, no wheezing, rales,rhonchi or crepitation. No use of accessory muscles of respiration.  CARDIOVASCULAR: S1, S2 normal. No murmurs, rubs, or gallops.  ABDOMEN: Soft, nontender, nondistended. Bowel sounds present. No organomegaly or mass.  EXTREMITIES: No pedal edema, cyanosis, or clubbing.  NEUROLOGIC: Arousable to painful stimuli Sedated, moves all extremities  Gait not checked.  PSYCHIATRIC: could not be assessed SKIN: No obvious rash, lesion, or ulcer.   LABORATORY PANEL:   CBC Recent Labs  Lab 10/25/17 2100  WBC 15.6*  HGB 12.6  HCT 38.0  PLT 258  MCV 84.4  MCH 28.0   MCHC 33.2  RDW 14.4  LYMPHSABS 2.2  MONOABS 1.2*  EOSABS 0.1  BASOSABS 0.1   ------------------------------------------------------------------------------------------------------------------  Chemistries  Recent Labs  Lab 10/25/17 2100  NA 143  K 4.4  CL 107  CO2 22  GLUCOSE 142*  BUN 38*  CREATININE 1.32*  CALCIUM 9.2  AST 38  ALT 26  ALKPHOS 51  BILITOT 1.3*   ------------------------------------------------------------------------------------------------------------------ estimated creatinine clearance is 20.6 mL/min (A) (by C-G formula based on SCr of 1.32 mg/dL (H)). ------------------------------------------------------------------------------------------------------------------ No results for input(s): TSH, T4TOTAL, T3FREE, THYROIDAB in the last 72 hours.  Invalid input(s): FREET3   Coagulation profile No results for input(s): INR, PROTIME in the last 168 hours. ------------------------------------------------------------------------------------------------------------------- No results for input(s): DDIMER in the last 72 hours. -------------------------------------------------------------------------------------------------------------------  Cardiac Enzymes No results for input(s): CKMB, TROPONINI, MYOGLOBIN in the last 168 hours.  Invalid input(s): CK ------------------------------------------------------------------------------------------------------------------ Invalid input(s): POCBNP  ---------------------------------------------------------------------------------------------------------------  Urinalysis    Component Value Date/Time   COLORURINE YELLOW (A) 10/25/2017 1953   APPEARANCEUR CLOUDY (A) 10/25/2017 1953   LABSPEC 1.024 10/25/2017 1953   PHURINE 5.0 10/25/2017 1953   GLUCOSEU NEGATIVE 10/25/2017 1953   HGBUR NEGATIVE 10/25/2017 1953   BILIRUBINUR NEGATIVE 10/25/2017 1953   KETONESUR 20 (A) 10/25/2017 1953   PROTEINUR NEGATIVE  10/25/2017 1953   NITRITE NEGATIVE 10/25/2017 1953   LEUKOCYTESUR MODERATE (A) 10/25/2017 1953     RADIOLOGY: Dg Forearm Right  Result Date: 10/25/2017 CLINICAL DATA:  Witnessed fall EXAM: RIGHT FOREARM - 2 VIEW COMPARISON:  None. FINDINGS: Subjective osteopenia. No fracture or malalignment. Degenerative changes at the distal radiocarpal and ulnar carpal joints. No significant elbow effusion IMPRESSION: Negative. Electronically Signed   By: Jasmine Pang M.D.   On: 10/25/2017 21:56   Ct Head Wo Contrast  Result Date: 10/25/2017 CLINICAL DATA:  Witnessed fall, history dementia, additionally has had nausea, vomiting and diarrhea for the last few days, history stroke, hypertension, diabetes mellitus EXAM: CT HEAD WITHOUT CONTRAST CT CERVICAL SPINE WITHOUT CONTRAST TECHNIQUE: Multidetector CT imaging of the head and cervical spine was performed following the standard protocol without intravenous contrast. Multiplanar CT image reconstructions of the cervical spine were also generated. COMPARISON:  09/17/2017 CT head FINDINGS: CT HEAD FINDINGS Brain: Generalized atrophy. Normal ventricular morphology. No midline shift or mass effect. Small vessel chronic ischemic changes of deep cerebral white matter. Old LEFT occipital infarct. No intracranial hemorrhage, mass lesion, evidence of acute infarction, or extra-axial fluid collection. Vascular: Mild atherosclerotic calcification of internal carotid arteries at skull base. Skull: Intact Sinuses/Orbits: Clear Other: N/A CT CERVICAL SPINE FINDINGS Alignment: Minimal anterolisthesis at C4-C5 likely due to facet degenerative changes. Remaining alignments normal Skull base and vertebrae: Osseous demineralization. Visualized skull base intact. Multilevel facet degenerative changes. Degenerative disc disease changes with disc space narrowing and endplate spur formation primarily at C5-C6. Vertebral body heights maintained without fracture or additional subluxation. No bone  destruction. Soft tissues and spinal canal: Prevertebral soft tissues normal thickness. Remaining visualized cervical soft tissues unremarkable. Disc levels:  No additional abnormalities Upper chest: Tips of lung apices clear Other: N/A IMPRESSION: Atrophy with small vessel chronic ischemic changes of deep cerebral white matter. Old LEFT occipital infarct. No acute intracranial abnormalities. Multilevel degenerative disc and facet disease changes of the cervical spine. No acute cervical spine abnormalities. Electronically Signed   By: Ulyses Southward M.D.   On: 10/25/2017 21:49   Ct Cervical Spine Wo Contrast  Result Date: 10/25/2017 CLINICAL DATA:  Witnessed fall, history dementia, additionally has had nausea, vomiting and diarrhea for the last few days, history stroke, hypertension, diabetes mellitus EXAM: CT HEAD WITHOUT CONTRAST CT CERVICAL SPINE WITHOUT CONTRAST TECHNIQUE: Multidetector CT imaging of the head and cervical spine was performed following the standard protocol without intravenous contrast. Multiplanar CT image reconstructions of the cervical spine were also generated. COMPARISON:  09/17/2017 CT head FINDINGS: CT HEAD FINDINGS Brain: Generalized atrophy. Normal ventricular morphology. No midline shift or mass effect. Small vessel chronic ischemic changes of deep cerebral white matter. Old LEFT occipital infarct. No intracranial hemorrhage, mass lesion, evidence of acute infarction, or extra-axial fluid collection. Vascular: Mild atherosclerotic calcification of internal carotid arteries at skull base. Skull: Intact Sinuses/Orbits: Clear Other: N/A CT CERVICAL SPINE FINDINGS Alignment: Minimal anterolisthesis at C4-C5 likely due to facet degenerative changes. Remaining alignments normal Skull base and vertebrae: Osseous demineralization. Visualized skull base intact. Multilevel facet degenerative changes. Degenerative disc disease changes with disc  space narrowing and endplate spur formation primarily  at C5-C6. Vertebral body heights maintained without fracture or additional subluxation. No bone destruction. Soft tissues and spinal canal: Prevertebral soft tissues normal thickness. Remaining visualized cervical soft tissues unremarkable. Disc levels:  No additional abnormalities Upper chest: Tips of lung apices clear Other: N/A IMPRESSION: Atrophy with small vessel chronic ischemic changes of deep cerebral white matter. Old LEFT occipital infarct. No acute intracranial abnormalities. Multilevel degenerative disc and facet disease changes of the cervical spine. No acute cervical spine abnormalities. Electronically Signed   By: Ulyses SouthwardMark  Boles M.D.   On: 10/25/2017 21:49    EKG: Orders placed or performed during the hospital encounter of 10/25/17  . ED EKG  . ED EKG  . EKG 12-Lead  . EKG 12-Lead    IMPRESSION AND PLAN: [age] year old female patient with history of Alzheimer's disease, diabetes mellitus, hypertension, atrial fibrillation was referred from [facility] for confusion, nausea vomiting diarrhea.  Admitting diagnosis 1.  Acute gastroenteritis 2.  Dehydration 3.  Altered mental status 4.  Acute cystitis 5.  Nausea and vomiting Treatment plan Admit patient to medical floor IV fluid hydration Start patient on IV Rocephin antibiotic 1 g daily Continue Xarelto for anticoagulation Antiemetics  All the records are reviewed and case discussed with ED provider. Management plans discussed with the patient, family and they are in agreement.  CODE STATUS:FULL CODE Code Status History    Date Active Date Inactive Code Status Order ID Comments User Context   09/17/2017 16:14 09/20/2017 17:49 Full Code [id number]  Marguarite ArbourSparks, Jeffrey D, MD ED       TOTAL TIME TAKING CARE OF THIS PATIENT: 50 minutes.    Ihor AustinPavan Stanislaus Kaltenbach M.D on 10/26/2017 at 1:18 AM  Between 7am to 6pm - Pager - [phone]  After 6pm go to [email or web address] - password [email or web address] [email or web address]  [facility]  Office   [phone]  CC: Primary care physician; Yetta BarreSastry, Sangeeta (Inactive)

## 2017-10-26 NOTE — Evaluation (Addendum)
Physical Therapy Evaluation Patient Details Name: Megan Arnold MRN: [id number] DOB: 11-Sep-1921 Today's Date: 10/26/2017   History of Present Illness  Pt is a 84 y.o. female with a known history of Alzheimer's dementia diabetes mellitus, hypertension, atrial fibrillation is a resident of [facility].  Patient was referred for fall and confusion.  Patient had a witnessed fall yesterday while trying to walk with her walker at around 5 PM.  No history of any head injury.  She has a skin tear in her right arm.  Patient also had nausea vomiting and diarrhea for the last few days according to the facility notes.   PMH includes a-fib, falls, Htn, DM, dementia and cerebral infarction.  Clinical Impression  Pt is a 84 y.o. Female who is in LTC at [facility].  Pt was in bed upon PT arrival and required mod A hand held assist to sit at EOB.  Pt attempted STS and on two occasions and required min-mod A for initiation and to maintain upright posture both times.  PT instructed pt to avoid posterior lean and pt continued to lean posteriorly.  Pt attempted to ambulate on three occasions and maintained posterior lean throughout.  Pt reported lower abdominal pain which intensified with upright posture.  Pt will continue to benefit from skilled PT to re-attempt ambulation and functional mobility.    Follow Up Recommendations Supervision/Assistance - 24 hour;SNF addendum 1436 addendum 1517    Equipment Recommendations       Recommendations for Other Services       Precautions / Restrictions Precautions Precautions: Fall Restrictions Weight Bearing Restrictions: No      Mobility  Bed Mobility Overal bed mobility: Needs Assistance Bed Mobility: Supine to Sit     Supine to sit: Mod assist     General bed mobility comments: Pt requires assistance for supine to sit and scooting to EOB.  Pt is able to initiate movement on her own intermittently but does not follow commands  consistently.  Transfers Overall transfer level: Needs assistance Equipment used: Rolling walker (2 wheeled) Transfers: Sit to/from Stand Sit to Stand: Mod assist         General transfer comment: Pt required assistance for initiation of STS and to maintain balance throughout transfer.  Pt tends to experience posterior LOB when upright.  Ambulation/Gait Ambulation/Gait assistance: Mod assist Ambulation Distance (Feet): 3 Feet Assistive device: Rolling walker (2 wheeled)     Gait velocity interpretation: Below normal speed for age/gender General Gait Details: Pt maintained posterior lean during three ambulation attempts, low foot clearance, shuffling gait, report of increased abdominal pain when standing.  Stairs            Wheelchair Mobility    Modified Rankin (Stroke Patients Only)       Balance Overall balance assessment: Needs assistance Sitting-balance support: Bilateral upper extremity supported;Feet supported       Standing balance support: Bilateral upper extremity supported   Standing balance comment: Pt requires mod A for standing balance to avoid posterior LOB.                             Pertinent Vitals/Pain Pain Assessment: Faces Faces Pain Scale: Hurts little more Pain Location: Lower abdominal pain Pain Intervention(s): Limited activity within patient's tolerance    Home Living Family/patient expects to be discharged to:: Skilled nursing facility  Prior Function Level of Independence: Needs assistance   Gait / Transfers Assistance Needed: Pt ambulates with RW  ADL's / Homemaking Assistance Needed: Unable to obtain hx from pt but pt requires assistance with bed mobility.        Hand Dominance        Extremity/Trunk Assessment   Upper Extremity Assessment Upper Extremity Assessment: Generalized weakness    Lower Extremity Assessment Lower Extremity Assessment: Overall WFL for tasks  assessed    Cervical / Trunk Assessment Cervical / Trunk Assessment: Kyphotic  Communication      Cognition Arousal/Alertness: Lethargic Behavior During Therapy: Flat affect Overall Cognitive Status: History of cognitive impairments - at baseline                                        General Comments      Exercises     Assessment/Plan    PT Assessment Patient needs continued PT services  PT Problem List Decreased strength;Decreased coordination;Decreased range of motion;Decreased cognition;Decreased activity tolerance;Decreased knowledge of use of DME;Decreased balance;Decreased safety awareness;Decreased mobility       PT Treatment Interventions DME instruction;Therapeutic exercise;Gait training;Balance training;Functional mobility training;Therapeutic activities    PT Goals (Current goals can be found in the Care Plan section)  Acute Rehab PT Goals Patient Stated Goal: Unable to obtain goals due to cognitive status.    Frequency Min 2X/week   Barriers to discharge        Co-evaluation               AM-PAC PT "6 Clicks" Daily Activity  Outcome Measure Difficulty turning over in bed (including adjusting bedclothes, sheets and blankets)?: A Little Difficulty moving from lying on back to sitting on the side of the bed? : A Lot Difficulty sitting down on and standing up from a chair with arms (e.g., wheelchair, bedside commode, etc,.)?: A Lot Help needed moving to and from a bed to chair (including a wheelchair)?: A Lot Help needed walking in hospital room?: A Lot Help needed climbing 3-5 steps with a railing? : Total 6 Click Score: 12    End of Session Equipment Utilized During Treatment: Gait belt Activity Tolerance: Patient limited by lethargy Patient left: in bed;with call bell/phone within reach;with bed alarm set Nurse Communication: Mobility status PT Visit Diagnosis: Unsteadiness on feet (R26.81);Other abnormalities of gait and  mobility (R26.89);History of falling (Z91.81);Muscle weakness (generalized) (M62.81)    Time: 1350-1410 PT Time Calculation (min) (ACUTE ONLY): 20 min   Charges:   PT Evaluation $PT Eval Moderate Complexity: 1 Mod PT Treatments $Therapeutic Activity: 8-22 mins   PT G Codes:   PT G-Codes **NOT FOR INPATIENT CLASS** Functional Assessment Tool Used: AM-PAC 6 Clicks Basic Mobility    Megan Arnold, PT, DPT   Megan Arnold 10/26/2017, 2:20 PM, addendum added 10/26/17, 1436, addendum 1517

## 2017-10-26 NOTE — Clinical Social Work Note (Signed)
Clinical Social Work Assessment  Patient Details  Name: Megan Arnold MRN: [id number] Date of Birth: 07/22/1921  Date of referral:  10/26/17               Reason for consult:  Facility Placement                Permission sought to share information with:  Facility Izetta DakinMedical sales representativeContact Representative Permission granted to share information::  Yes, Verbal Permission Granted  Name::     Orlin HildingHatley,Annett B Daughter [phone]  or Michelle NasutiHatley, Joseph Grandson   [phone]   Agency::  SNF admissions  Relationship::     Contact Information:     Housing/Transportation Living arrangements for the past 2 months:  Skilled Nursing Facility Source of Information:  Medical Team Patient Interpreter Needed:  None Criminal Activity/Legal Involvement Pertinent to Current Situation/Hospitalization:  No - Comment as needed Significant Relationships:  Adult Children Lives with:  Facility Resident Do you feel safe going back to the place where you live?  Yes Need for family participation in patient care:  Yes (Comment)  Care giving concerns:  [facility] plan is to return back to SNF.   Social Worker assessment / plan:  Patient is a 84 year old female who has dementia.  Patient is alert and oriented x1, CSW attempted to contact patient's family members to complete assessment, but had to leave a message awaiting call back.  Per [facility] patient has been at SNF for about 2 years, they reported that patient can return back to SNF once she is medically ready for discharge.  Per [facility], patient chooses when she wants to be more active, sometimes she is active other times she likes to just rest.  CSW sent requested information to SNF.    Employment status:  Retired Health and safety inspectornsurance information:  Medicare PT Recommendations:  Skilled Nursing Facility Information / Referral to community resources:  Skilled Nursing Facility  Patient/Family's Response to care:  Unable to speak to  family or patient about having her return, left a message awaiting call back.  Patient/Family's Understanding of and Emotional Response to Diagnosis, Current Treatment, and Prognosis:  Patient has dementia, and is not aware of current treatment plan or prognosis.  Emotional Assessment Appearance:  Appears stated age Attitude/Demeanor/Rapport:    Affect (typically observed):  Appropriate, Calm Orientation:  Oriented to Self Alcohol / Substance use:  Not Applicable Psych involvement (Current and /or in the community):  No (Comment)  Discharge Needs  Concerns to be addressed:    Readmission within the last 30 days:  No Current discharge risk:  Cognitively Impaired Barriers to Discharge:  Continued Medical Work up   [place] [employer], Zailyn Rowser R, LCSWA 10/26/2017, 6:47 PM

## 2017-10-26 NOTE — ED Notes (Signed)
Pt has not had a BM or stated she needed to have a BM at this time.

## 2017-10-26 NOTE — ED Notes (Signed)
Per floor RN, waiting for new bed to arrive to rm and will call this RN when it arrives so pt can be transported.

## 2017-10-26 NOTE — ED Notes (Signed)
Pt is in behavorial bed that is at the lowest placement, has on yellow socks and yellow arm band, alarm set on bed.

## 2017-10-27 LAB — BASIC METABOLIC PANEL
Anion gap: 8 (ref 5–15)
BUN: 19 mg/dL (ref 6–20)
CO2: 21 mmol/L — ABNORMAL LOW (ref 22–32)
Calcium: 8.2 mg/dL — ABNORMAL LOW (ref 8.9–10.3)
Chloride: 114 mmol/L — ABNORMAL HIGH (ref 101–111)
Creatinine, Ser: 0.78 mg/dL (ref 0.44–1.00)
GFR calc Af Amer: >60 mL/min (ref >60)
GLUCOSE: 65 mg/dL (ref 65–99)
POTASSIUM: 3.8 mmol/L (ref 3.5–5.1)
Sodium: 143 mmol/L (ref 135–145)

## 2017-10-27 LAB — GLUCOSE, CAPILLARY
GLUCOSE-CAPILLARY: 83 mg/dL (ref 65–99)
Glucose-Capillary: 108 mg/dL — ABNORMAL HIGH (ref 65–99)
Glucose-Capillary: 85 mg/dL (ref 65–99)
Glucose-Capillary: 85 mg/dL (ref 65–99)

## 2017-10-27 LAB — URINE CULTURE: CULTURE: NO GROWTH

## 2017-10-27 MED ORDER — DOCUSATE SODIUM 100 MG PO CAPS
100.0000 mg | ORAL_CAPSULE | Freq: Every day | ORAL | Status: DC
  Administered 2017-10-28 – 2017-10-30 (×3): 100 mg via ORAL
  Filled 2017-10-27 (×4): qty 1

## 2017-10-27 MED ORDER — HALOPERIDOL LACTATE 5 MG/ML IJ SOLN
0.5000 mg | Freq: Once | INTRAMUSCULAR | Status: AC
  Administered 2017-10-27: 13:00:00 0.5 mg via INTRAVENOUS
  Filled 2017-10-27: qty 1

## 2017-10-27 MED ORDER — CEFTRIAXONE SODIUM 1 G IJ SOLR
1.0000 g | INTRAMUSCULAR | Status: AC
  Administered 2017-10-27 – 2017-10-29 (×3): 1 g via INTRAVENOUS
  Filled 2017-10-27 (×3): qty 10

## 2017-10-27 NOTE — Progress Notes (Signed)
Family Meeting Note  Advance Directive:no  Today a meeting took place with the daughter.  Patient is unable to participate due ZO:XWRUEAto:Lacked capacity agitated.   The following clinical team members were present during this meeting:MD  The following were discussed:Patient's diagnosis: , Patient's progosis: Unable to determine and Goals for treatment: DNR  Additional follow-up to be provided: Palliative care  Time spent during discussion:20 minutes  Altamese DillingVaibhavkumar Brynlie Daza, MD

## 2017-10-27 NOTE — Progress Notes (Signed)
Pharmacist - Prescriber Communication  Ceftriaxone dose modified from 2 gm IV Q24H to 1 gm IV Q24H due to indication based dosing (1 gm for UTI). Entered for 3 more doses to complete a 5 day course. Urine culture results pending.  Rhiannan Kievit A. Farmer [place], VermontPharm.D., BCPS Clinical Pharmacist 10/27/2017 07:55

## 2017-10-27 NOTE — Plan of Care (Signed)
  Progressing Urinary Elimination: Signs and symptoms of infection will decrease 10/27/2017 0410 - Progressing by Peterson LombardKlenner, Nassim Cosma C, RN Education: Knowledge of General Education information will improve 10/27/2017 0410 - Progressing by Peterson LombardKlenner, Saryna Kneeland C, RN Clinical Measurements: Ability to maintain clinical measurements within normal limits will improve 10/27/2017 0410 - Progressing by Peterson LombardKlenner, Zaniel Marineau C, RN Elimination: Will not experience complications related to bowel motility 10/27/2017 0410 - Progressing by Peterson LombardKlenner, Flower Franko C, RN Skin Integrity: Risk for impaired skin integrity will decrease 10/27/2017 0410 - Progressing by Peterson LombardKlenner, Rodney Yera C, RN

## 2017-10-27 NOTE — Progress Notes (Signed)
Pt awake.  Because of low blood sugar last night and poor appetite, gave pt bites of applesauce and sips of Ensure.  Pt reports stomach pain after eating.  Pt groaning.  Gave Zofran IV for nausea. Henriette CombsSarah Sharmaine Bain RN

## 2017-10-27 NOTE — Progress Notes (Signed)
[facility] - [place] at [facility]   PATIENT NAME: Megan Arnold    MR#:  [id number]  DATE OF BIRTH:  [birth year]  SUBJECTIVE:  CHIEF COMPLAINT:   Chief Complaint  Patient presents with  . Fall  . Emesis  . Altered Mental Status   - Came in after an unwitnessed fall. Significantly confused and irritable. -Has known dementia. Receiving IV fluids for dehydration.  Agitated today.  REVIEW OF SYSTEMS:  Review of Systems  Unable to perform ROS: Dementia    DRUG ALLERGIES:   Allergies  Allergen Reactions  . Penicillins     Has patient had a PCN reaction causing immediate rash, facial/tongue/throat swelling, SOB or lightheadedness with hypotension: Yes Has patient had a PCN reaction causing severe rash involving mucus membranes or skin necrosis: Yes Has patient had a PCN reaction that required hospitalization: Yes Has patient had a PCN reaction occurring within the last 10 years: No If all of the above answers are "NO", then may proceed with Cephalosporin use.     VITALS:  Blood pressure 128/68, pulse 79, temperature 98.6 F (37 C), temperature source Oral, resp. rate 18, height 5\' 2"  (1.575 m), weight 56.7 kg (125 lb), SpO2 98 %.  PHYSICAL EXAMINATION:  Physical Exam  GENERAL:  84 y.o.-year-old patient lying in the bed with no acute distress.  EYES: Pupils equal, round, reactive to light and accommodation. No scleral icterus. Extraocular muscles intact.  HEENT: Head atraumatic, normocephalic. Oropharynx and nasopharynx clear.  NECK:  Supple, no jugular venous distention. No thyroid enlargement, no tenderness.  LUNGS: Normal breath sounds bilaterally, no wheezing, rales,rhonchi or crepitation. No use of accessory muscles of respiration.  CARDIOVASCULAR: S1, S2 normal. No  rubs, or gallops. 2/6 systolic murmur present ABDOMEN: Soft, nontender, nondistended. Bowel sounds present. No organomegaly or mass.  EXTREMITIES: No pedal edema, cyanosis, or  clubbing.  NEUROLOGIC: Cranial nerves II through XII are intact. Muscle strength 5/5 in all extremities. Sensation intact. Gait not checked. Global weakness noted. PSYCHIATRIC: The patient is alert and oriented to self, very irritable at times.  SKIN: No obvious rash, lesion, or ulcer.    LABORATORY PANEL:   CBC Recent Labs  Lab 10/26/17 0423  WBC 9.6  HGB 10.7*  HCT 32.2*  PLT 211   ------------------------------------------------------------------------------------------------------------------  Chemistries  Recent Labs  Lab 10/25/17 2100  10/27/17 0537  NA 143   < > 143  K 4.4   < > 3.8  CL 107   < > 114*  CO2 22   < > 21*  GLUCOSE 142*   < > 65  BUN 38*   < > 19  CREATININE 1.32*   < > 0.78  CALCIUM 9.2   < > 8.2*  AST 38  --   --   ALT 26  --   --   ALKPHOS 51  --   --   BILITOT 1.3*  --   --    < > = values in this interval not displayed.   ------------------------------------------------------------------------------------------------------------------  Cardiac Enzymes No results for input(s): TROPONINI in the last 168 hours. ------------------------------------------------------------------------------------------------------------------  RADIOLOGY:  Dg Forearm Right  Result Date: 10/25/2017 CLINICAL DATA:  Witnessed fall EXAM: RIGHT FOREARM - 2 VIEW COMPARISON:  None. FINDINGS: Subjective osteopenia. No fracture or malalignment. Degenerative changes at the distal radiocarpal and ulnar carpal joints. No significant elbow effusion IMPRESSION: Negative. Electronically Signed   By: Jasmine Pang M.D.   On: 10/25/2017 21:56   Ct Head  Wo Contrast  Result Date: 10/25/2017 CLINICAL DATA:  Witnessed fall, history dementia, additionally has had nausea, vomiting and diarrhea for the last few days, history stroke, hypertension, diabetes mellitus EXAM: CT HEAD WITHOUT CONTRAST CT CERVICAL SPINE WITHOUT CONTRAST TECHNIQUE: Multidetector CT imaging of the head and cervical  spine was performed following the standard protocol without intravenous contrast. Multiplanar CT image reconstructions of the cervical spine were also generated. COMPARISON:  09/17/2017 CT head FINDINGS: CT HEAD FINDINGS Brain: Generalized atrophy. Normal ventricular morphology. No midline shift or mass effect. Small vessel chronic ischemic changes of deep cerebral white matter. Old LEFT occipital infarct. No intracranial hemorrhage, mass lesion, evidence of acute infarction, or extra-axial fluid collection. Vascular: Mild atherosclerotic calcification of internal carotid arteries at skull base. Skull: Intact Sinuses/Orbits: Clear Other: N/A CT CERVICAL SPINE FINDINGS Alignment: Minimal anterolisthesis at C4-C5 likely due to facet degenerative changes. Remaining alignments normal Skull base and vertebrae: Osseous demineralization. Visualized skull base intact. Multilevel facet degenerative changes. Degenerative disc disease changes with disc space narrowing and endplate spur formation primarily at C5-C6. Vertebral body heights maintained without fracture or additional subluxation. No bone destruction. Soft tissues and spinal canal: Prevertebral soft tissues normal thickness. Remaining visualized cervical soft tissues unremarkable. Disc levels:  No additional abnormalities Upper chest: Tips of lung apices clear Other: N/A IMPRESSION: Atrophy with small vessel chronic ischemic changes of deep cerebral white matter. Old LEFT occipital infarct. No acute intracranial abnormalities. Multilevel degenerative disc and facet disease changes of the cervical spine. No acute cervical spine abnormalities. Electronically Signed   By: Ulyses SouthwardMark  Boles M.D.   On: 10/25/2017 21:49   Ct Cervical Spine Wo Contrast  Result Date: 10/25/2017 CLINICAL DATA:  Witnessed fall, history dementia, additionally has had nausea, vomiting and diarrhea for the last few days, history stroke, hypertension, diabetes mellitus EXAM: CT HEAD WITHOUT CONTRAST  CT CERVICAL SPINE WITHOUT CONTRAST TECHNIQUE: Multidetector CT imaging of the head and cervical spine was performed following the standard protocol without intravenous contrast. Multiplanar CT image reconstructions of the cervical spine were also generated. COMPARISON:  09/17/2017 CT head FINDINGS: CT HEAD FINDINGS Brain: Generalized atrophy. Normal ventricular morphology. No midline shift or mass effect. Small vessel chronic ischemic changes of deep cerebral white matter. Old LEFT occipital infarct. No intracranial hemorrhage, mass lesion, evidence of acute infarction, or extra-axial fluid collection. Vascular: Mild atherosclerotic calcification of internal carotid arteries at skull base. Skull: Intact Sinuses/Orbits: Clear Other: N/A CT CERVICAL SPINE FINDINGS Alignment: Minimal anterolisthesis at C4-C5 likely due to facet degenerative changes. Remaining alignments normal Skull base and vertebrae: Osseous demineralization. Visualized skull base intact. Multilevel facet degenerative changes. Degenerative disc disease changes with disc space narrowing and endplate spur formation primarily at C5-C6. Vertebral body heights maintained without fracture or additional subluxation. No bone destruction. Soft tissues and spinal canal: Prevertebral soft tissues normal thickness. Remaining visualized cervical soft tissues unremarkable. Disc levels:  No additional abnormalities Upper chest: Tips of lung apices clear Other: N/A IMPRESSION: Atrophy with small vessel chronic ischemic changes of deep cerebral white matter. Old LEFT occipital infarct. No acute intracranial abnormalities. Multilevel degenerative disc and facet disease changes of the cervical spine. No acute cervical spine abnormalities. Electronically Signed   By: Ulyses SouthwardMark  Boles M.D.   On: 10/25/2017 21:49    EKG:   Orders placed or performed during the hospital encounter of 10/25/17  . ED EKG  . ED EKG  . EKG 12-Lead  . EKG 12-Lead    ASSESSMENT AND PLAN:  84 year old female with known history of dementia, hypertension, atrial fibrillation, diabetes mellitus was brought in from assisted living facility secondary to fall and confusion  1. Fall and confusion-likely due to gait issues. - UTI - rocephine -Physical therapy consult pending. No other source of infection identified. -CT of the head showing an old stroke.  2. Acute renal failure-secondary to dehydration. Receiving IV fluids with significant improvement  3. Hypokalemia-replaced appropriately  4. Diabetes mellitus-on Amaryl and tradjenta. -follow-up sugars, start on sliding scale insulin  5. Recent stroke-no deficits noted. Continue statin and aspirin  6. History of atrial fibrillation-rate controlled. On metoprolol and also on Xarelto for anticoagulation.  7. Altered mental status   Have some baseline dementia and agitation.   As per daughter, this is baseline.  Daughter updated over the phone.  All the records are reviewed and case discussed with Care Management/Social Workerr. Management plans discussed with the patient, family and they are in agreement.  CODE STATUS: Full Code  TOTAL TIME TAKING CARE OF THIS PATIENT: 37 minutes.   POSSIBLE D/C IN 1-2 DAYS, DEPENDING ON CLINICAL CONDITION.   Altamese Dilling M.D on 10/27/2017 at 2:21 PM  Between 7am to 6pm - Pager - [phone]  After 6pm go to [email or web address] - Social research officer, government  [facility]  Office  [phone]  CC: Primary care physician; Yetta Barre (Inactive)

## 2017-10-27 NOTE — Progress Notes (Signed)
Inpatient Diabetes Program Recommendations  AACE/ADA: New Consensus Statement on Inpatient Glycemic Control (2015)  Target Ranges:  Prepandial:   less than 140 mg/dL      Peak postprandial:   less than 180 mg/dL (1-2 hours)      Critically ill patients:  140 - 180 mg/dL   Lab Results  Component Value Date   GLUCAP 85 10/27/2017   HGBA1C 6.7 (H) 09/18/2017    Review of Glycemic Control  Results for Megan DakinGRANVILLE, Arden H (MRN [id number]) as of 10/27/2017 12:31  Ref. Range 10/26/2017 16:53 10/26/2017 21:39 10/26/2017 22:10 10/27/2017 07:57 10/27/2017 11:40  Glucose-Capillary Latest Ref Range: 65 - 99 mg/dL 82 53 (L) 664137 (H) 403108 (H) 85    Diabetes history: Type 2 Outpatient Diabetes medications: Amaryl 2mg  qam, Januvia 25mg /day  Current orders for Inpatient glycemic control: Novolog 0-9 units tid, Novolog 0-5 units qhs  Inpatient Diabetes Program Recommendations: Please consider NOT re-ordering Glimiperide when patient is discharged- based on this patient's age , this medication likely possess more risk than benefit.   Agree with current medication orders.   Susette RacerJulie Andreya Lacks, RN, BA, MHA, CDE Diabetes Coordinator Inpatient Diabetes Program  [phone] (Team Pager) [phone] [facility] Office) 10/27/2017 12:46 PM

## 2017-10-27 NOTE — Progress Notes (Signed)
Physical Therapy Treatment Patient Details Name: Megan Arnold MRN: [id number] DOB: 12/02/1920 Today's Date: 10/27/2017    History of Present Illness Pt is a 84 y.o. female with a known history of Alzheimer's dementia diabetes mellitus, hypertension, atrial fibrillation is a resident of [facility].  Patient was referred for fall and confusion.  Patient had a witnessed fall yesterday while trying to walk with her walker at around 5 PM.  No history of any head injury.  She has a skin tear in her right arm.  Patient also had nausea vomiting and diarrhea for the last few days according to the facility notes.   PMH includes a-fib, falls, Htn, DM, dementia and cerebral infarction.    PT Comments    Pt presents with deficits in strength, transfers, mobility, gait, balance, and activity tolerance.  Pt lethargic with extensive cueing to maintain attention to task and for sequencing.  Pt required mod A with bed mobility and transfers with poor stability in standing secondary to posterior lean that improved grossly during session.  Pt able to amb 2 x 3-4' with RW and min-mod A to prevent posterior LOB.  Pt will benefit from PT services in a SNF setting upon discharge to safely address above deficits for decreased caregiver assistance and eventual return to PLOF.     Follow Up Recommendations  Supervision/Assistance - 24 hour;SNF     Equipment Recommendations       Recommendations for Other Services       Precautions / Restrictions Precautions Precautions: Fall Restrictions Weight Bearing Restrictions: No    Mobility  Bed Mobility Overal bed mobility: Needs Assistance Bed Mobility: Supine to Sit;Sit to Supine     Supine to sit: Mod assist Sit to supine: Mod assist   General bed mobility comments: Pt requires assistance for supine to/from sit and scooting to EOB.  Pt is able to initiate movement on her own intermittently but does not follow commands  consistently.  Transfers Overall transfer level: Needs assistance Equipment used: Rolling walker (2 wheeled) Transfers: Sit to/from Stand Sit to Stand: Mod assist         General transfer comment: Pt required tactile cues for proper hand placement during sit to/from stand transfers  Ambulation/Gait Ambulation/Gait assistance: Min assist;Mod assist Ambulation Distance (Feet): 4 Feet Assistive device: Rolling walker (2 wheeled) Gait Pattern/deviations: Step-through pattern;Decreased step length - right;Decreased step length - left   Gait velocity interpretation: <1.8 ft/sec, indicative of risk for recurrent falls General Gait Details: Posterior instability during amb with occasional min-mod A to prevent LOB   Stairs            Wheelchair Mobility    Modified Rankin (Stroke Patients Only)       Balance Overall balance assessment: Needs assistance Sitting-balance support: Bilateral upper extremity supported;Feet supported Sitting balance-Leahy Scale: Fair     Standing balance support: Bilateral upper extremity supported Standing balance-Leahy Scale: Poor Standing balance comment: Pt requires occasional min-mod A for standing balance to avoid posterior LOB.                            Cognition Arousal/Alertness: Lethargic Behavior During Therapy: Flat affect Overall Cognitive Status: No family/caregiver present to determine baseline cognitive functioning  Exercises Total Joint Exercises Ankle Circles/Pumps: AROM;Both(Extensive verbal and tactile cues for technique/participation) Hip ABduction/ADduction: AROM;AAROM;Both;5 reps Straight Leg Raises: AROM;AAROM;Both;5 reps Other Exercises Other Exercises: Static sitting at EOB with reaching activities outside BOS Other Exercises: Static standing activities for increased stability and activity tolerance    General Comments        Pertinent  Vitals/Pain Pain Assessment: No/denies pain    Home Living                      Prior Function            PT Goals (current goals can now be found in the care plan section) Progress towards PT goals: Progressing toward goals    Frequency    Min 2X/week      PT Plan Current plan remains appropriate    Co-evaluation              AM-PAC PT "6 Clicks" Daily Activity  Outcome Measure                   End of Session Equipment Utilized During Treatment: Gait belt Activity Tolerance: Patient limited by lethargy Patient left: in bed;with call bell/phone within reach;with bed alarm set Nurse Communication: Mobility status;Other (comment)(Toe nail concerns on L 2nd toe) PT Visit Diagnosis: Unsteadiness on feet (R26.81);Other abnormalities of gait and mobility (R26.89);History of falling (Z91.81);Muscle weakness (generalized) (M62.81)     Time: 2130-8657 PT Time Calculation (min) (ACUTE ONLY): 29 min  Charges:  $Therapeutic Exercise: 8-22 mins $Therapeutic Activity: 8-22 mins                    G Codes:       DElly Modena PT, DPT 10/27/17, 11:37 AM

## 2017-10-28 ENCOUNTER — Inpatient Hospital Stay: Payer: Medicare Other

## 2017-10-28 LAB — GLUCOSE, CAPILLARY
GLUCOSE-CAPILLARY: 101 mg/dL — AB (ref 65–99)
Glucose-Capillary: 87 mg/dL (ref 65–99)
Glucose-Capillary: 100 mg/dL — ABNORMAL HIGH (ref 65–99)

## 2017-10-28 LAB — BASIC METABOLIC PANEL
ANION GAP: 12 (ref 5–15)
BUN: 9 mg/dL (ref 6–20)
CALCIUM: 8.4 mg/dL — AB (ref 8.9–10.3)
CO2: 21 mmol/L — AB (ref 22–32)
CREATININE: 0.77 mg/dL (ref 0.44–1.00)
Chloride: 108 mmol/L (ref 101–111)
GFR calc Af Amer: >60 mL/min (ref >60)
Glucose, Bld: 114 mg/dL — ABNORMAL HIGH (ref 65–99)
Potassium: 3.6 mmol/L (ref 3.5–5.1)
SODIUM: 141 mmol/L (ref 135–145)

## 2017-10-28 LAB — MAGNESIUM: MAGNESIUM: 1.6 mg/dL — AB (ref 1.7–2.4)

## 2017-10-28 MED ORDER — LORAZEPAM 2 MG/ML IJ SOLN
0.5000 mg | INTRAMUSCULAR | Status: DC | PRN
  Administered 2017-10-28 (×2): 1 mg via INTRAVENOUS
  Administered 2017-10-28: 10:00:00 0.5 mg via INTRAVENOUS
  Filled 2017-10-28 (×2): qty 1

## 2017-10-28 MED ORDER — HALOPERIDOL LACTATE 5 MG/ML IJ SOLN
1.0000 mg | INTRAMUSCULAR | Status: AC
  Administered 2017-10-28: 1 mg via INTRAVENOUS

## 2017-10-28 MED ORDER — HALOPERIDOL 0.5 MG PO TABS
0.5000 mg | ORAL_TABLET | Freq: Two times a day (BID) | ORAL | Status: DC
  Administered 2017-10-28 – 2017-10-29 (×2): 0.5 mg via ORAL
  Filled 2017-10-28 (×3): qty 1

## 2017-10-28 MED ORDER — MAGNESIUM SULFATE 2 GM/50ML IV SOLN
2.0000 g | Freq: Once | INTRAVENOUS | Status: AC
  Administered 2017-10-28: 19:00:00 2 g via INTRAVENOUS
  Filled 2017-10-28: qty 50

## 2017-10-28 MED ORDER — POTASSIUM CHLORIDE 20 MEQ PO PACK: 40.0000 meq | PACK | Freq: Once | ORAL | Status: DC

## 2017-10-28 MED ORDER — LORAZEPAM 2 MG/ML IJ SOLN
0.5000 mg | INTRAMUSCULAR | Status: DC | PRN
  Administered 2017-10-28 – 2017-10-30 (×7): 1 mg via INTRAVENOUS
  Filled 2017-10-28 (×7): qty 1

## 2017-10-28 MED ORDER — HALOPERIDOL LACTATE 5 MG/ML IJ SOLN
INTRAMUSCULAR | Status: AC
  Filled 2017-10-28: qty 1

## 2017-10-28 NOTE — Progress Notes (Addendum)
Pt had an unwitnessed fall out of bed at 0135.  She is baseline confused and impulsive.  It does appear that she hit her head in the back, so Dr. Anne HahnWillis ordered a STAT CT of the head.  She has a skin tear on her right lower leg that has appeared to have reopened, so thin film and foam were applied after cleansing.  AC notified.

## 2017-10-28 NOTE — Progress Notes (Signed)
PT Cancellation Note  Patient Details Name: Megan Arnold MRN: [id number] DOB: 07/07/1921   Cancelled Treatment:    Reason Eval/Treat Not Completed: Other (comment); Spoke to nursing prior to entering pt's room with nursing requesting no PT this date secondary to pt currently being highly agitated and not appropriate for PT services.  Will attempt to see pt at a future date as medically appropriate.     Ovidio Hanger. Scott Demarius Archila PT, DPT 10/28/17, 3:16 PM

## 2017-10-28 NOTE — Progress Notes (Signed)
Pt had another unwitnessed fall even with the sensitive bed alarm setting.  She was found half in and half out of the bed, with her bottom and legs on the floor.  No injury noted at this time.  Pt was combative when trying to help her back in the bed.  MD ordered a telesitter at this time.  AC notified and family.

## 2017-10-28 NOTE — Progress Notes (Signed)
[employer] [facility] - [place] at [facility]   PATIENT NAME: Nazia Rhines    MR#:  [id number]  DATE OF BIRTH:  1921-01-28  SUBJECTIVE:  CHIEF COMPLAINT:   Chief Complaint  Patient presents with  . Fall  . Emesis  . Altered Mental Status   - Came in after an unwitnessed fall. Significantly confused and irritable. -Has known dementia. Receiving IV fluids for dehydration.  Agitated today. Was on bedside sitter, I discontinued that.   REVIEW OF SYSTEMS:  Review of Systems  Unable to perform ROS: Dementia    DRUG ALLERGIES:   Allergies  Allergen Reactions  . Penicillins     Has patient had a PCN reaction causing immediate rash, facial/tongue/throat swelling, SOB or lightheadedness with hypotension: Yes Has patient had a PCN reaction causing severe rash involving mucus membranes or skin necrosis: Yes Has patient had a PCN reaction that required hospitalization: Yes Has patient had a PCN reaction occurring within the last 10 years: No If all of the above answers are "NO", then may proceed with Cephalosporin use.     VITALS:  Blood pressure (!) 154/98, pulse (!) 105, temperature 98 F (36.7 C), temperature source Oral, resp. rate 16, height 5\' 2"  (1.575 m), weight 56.7 kg (125 lb), SpO2 97 %.  PHYSICAL EXAMINATION:  Physical Exam  GENERAL:  84 y.o.-year-old patient lying in the bed with no acute distress.  EYES: Pupils equal, round, reactive to light and accommodation. No scleral icterus. Extraocular muscles intact.  HEENT: Head atraumatic, normocephalic. Oropharynx and nasopharynx clear.  NECK:  Supple, no jugular venous distention. No thyroid enlargement, no tenderness.  LUNGS: Normal breath sounds bilaterally, no wheezing, rales,rhonchi or crepitation. No use of accessory muscles of respiration.  CARDIOVASCULAR: S1, S2 normal. No  rubs, or gallops. 2/6 systolic murmur present ABDOMEN: Soft, nontender, nondistended. Bowel sounds present. No organomegaly or  mass.  EXTREMITIES: No pedal edema, cyanosis, or clubbing.  NEUROLOGIC: Cranial nerves II through XII are intact. Muscle strength 4/5 in all extremities. Sensation intact. Gait not checked. Global weakness noted. PSYCHIATRIC: The patient is alert and oriented to self, very irritable at times.  SKIN: No obvious rash, lesion, or ulcer.    LABORATORY PANEL:   CBC Recent Labs  Lab 10/26/17 0423  WBC 9.6  HGB 10.7*  HCT 32.2*  PLT 211   ------------------------------------------------------------------------------------------------------------------  Chemistries  Recent Labs  Lab 10/25/17 2100  10/27/17 0537  NA 143   < > 143  K 4.4   < > 3.8  CL 107   < > 114*  CO2 22   < > 21*  GLUCOSE 142*   < > 65  BUN 38*   < > 19  CREATININE 1.32*   < > 0.78  CALCIUM 9.2   < > 8.2*  AST 38  --   --   ALT 26  --   --   ALKPHOS 51  --   --   BILITOT 1.3*  --   --    < > = values in this interval not displayed.   ------------------------------------------------------------------------------------------------------------------  Cardiac Enzymes No results for input(s): TROPONINI in the last 168 hours. ------------------------------------------------------------------------------------------------------------------  RADIOLOGY:  Ct Head Wo Contrast  Result Date: 10/28/2017 CLINICAL DATA:  Status post fall, with confusion. Hit back of head. Initial encounter. EXAM: CT HEAD WITHOUT CONTRAST TECHNIQUE: Contiguous axial images were obtained from the base of the skull through the vertex without intravenous contrast. COMPARISON:  CT of the head performed  10/25/2017, and MRI of the brain performed 09/19/2017 FINDINGS: Brain: No evidence of acute infarction, hemorrhage, hydrocephalus, extra-axial collection or mass lesion / mass effect. Prominence of the ventricles and sulci reflects mild to moderate cortical volume loss. Scattered periventricular white matter change likely reflects small vessel  ischemic microangiopathy. A chronic infarct is noted at the left occipital lobe, with associated encephalomalacia. The brainstem and fourth ventricle are within normal limits. The basal ganglia are unremarkable in appearance. No mass effect or midline shift is seen. Vascular: No hyperdense vessel or unexpected calcification. Skull: There is no evidence of fracture; visualized osseous structures are unremarkable in appearance. Sinuses/Orbits: The orbits are within normal limits. The paranasal sinuses and mastoid air cells are well-aerated. Other: No significant soft tissue abnormalities are seen. IMPRESSION: 1. No evidence of traumatic intracranial injury or fracture. 2. Mild to moderate cortical volume loss and scattered small vessel ischemic microangiopathy. 3. Chronic infarct at the left occipital lobe, with associated encephalomalacia. Electronically Signed   By: Roanna RaiderJeffery  Chang M.D.   On: 10/28/2017 02:30    EKG:   Orders placed or performed during the hospital encounter of 10/25/17  . ED EKG  . ED EKG  . EKG 12-Lead  . EKG 12-Lead    ASSESSMENT AND PLAN:   84 year old female with known history of dementia, hypertension, atrial fibrillation, diabetes mellitus was brought in from assisted living facility secondary to fall and confusion  1. Fall and confusion-likely due to gait issues. - UTI - rocephine -Physical therapy consult pending. No other source of infection identified. -CT of the head showing an old stroke. - Patient again had the fall in addition to trying to come out of the bed in hospital, repeat CT had last night is negative.  2. Acute renal failure-secondary to dehydration. Receiving IV fluids with significant improvement  3. Hypokalemia-replaced appropriately  4. Diabetes mellitus-on Amaryl and tradjenta. -follow-up sugars, start on sliding scale insulin   Hold Oral medication as she has decreased oral intake and blood sugar runs in low normal side.  5. Recent stroke-no  deficits noted. Continue statin and aspirin  6. History of atrial fibrillation-rate controlled. On metoprolol and also on Xarelto for anticoagulation.  7. Altered mental status   Have some baseline dementia and agitation.   As per daughter, this is baseline.   Maybe some of this is due to her recent stroke.   Will try to control with Ativan and Haldol.  Daughter updated over the phone.  All the records are reviewed and case discussed with Care Management/Social Workerr. Management plans discussed with the patient, family and they are in agreement.  CODE STATUS: DNR  TOTAL TIME TAKING CARE OF THIS PATIENT: 35 minutes.   POSSIBLE D/C IN 1-2 DAYS, DEPENDING ON CLINICAL CONDITION. Her persistent agitation is an issue on her discharge, if this doesn't get better then she may need to have palliative care evaluation.  Altamese DillingVaibhavkumar Artur Winningham M.D on 10/28/2017 at 1:43 PM  Between 7am to 6pm - Pager - [phone]  After 6pm go to [email or web address] - Social research officer, governmentpassword EPAS [facility]  [facility] [place] [facility]  Office  [phone]  CC: Primary care physician; Yetta BarreSastry, Sangeeta (Inactive)

## 2017-10-28 NOTE — Progress Notes (Signed)
Pharmacy Antibiotic Note  Megan Arnold is a 84 y.o. female admitted on 10/25/2017 with UTI.  Pharmacy has been consulted for ceftriaxone dosing.  Plan: Ceftriaxone 1 grams q 24 hours ordered for a total of 5 doses.  Height: 5\' 2"  (157.5 cm) Weight: 125 lb (56.7 kg) IBW/kg (Calculated) : 50.1  Temp (24hrs), Avg:98.1 F (36.7 C), Min:97.5 F (36.4 C), Max:99.1 F (37.3 C)  Recent Labs  Lab 10/25/17 2100 10/26/17 0423 10/27/17 0537  WBC 15.6* 9.6  --   CREATININE 1.32* 0.97 0.78    Estimated Creatinine Clearance: 34 mL/min (by C-G formula based on SCr of 0.78 mg/dL).    Allergies  Allergen Reactions  . Penicillins     Has patient had a PCN reaction causing immediate rash, facial/tongue/throat swelling, SOB or lightheadedness with hypotension: Yes Has patient had a PCN reaction causing severe rash involving mucus membranes or skin necrosis: Yes Has patient had a PCN reaction that required hospitalization: Yes Has patient had a PCN reaction occurring within the last 10 years: No If all of the above answers are "NO", then may proceed with Cephalosporin use.     Antimicrobials this admission: Ceftriaxone 2/5  >>    >>   Dose adjustments this admission:   Microbiology results: 2/5 UCx: pending       2/5 UA: LE(+) NO2(-)  WBC 6-30 Thank you for allowing pharmacy to be a part of this patient's care.  Carola FrostNathan A Temitope Flammer, Pharm.D., BCPS Clinical Pharmacist 10/28/2017 3:02 PM

## 2017-10-28 NOTE — Progress Notes (Signed)
MEDICATION RELATED CONSULT NOTE - INITIAL   Pharmacy Consult for electrolyte management Indication: borderline prolonged QTc with low dose Haldol  Allergies  Allergen Reactions  . Penicillins     Has patient had a PCN reaction causing immediate rash, facial/tongue/throat swelling, SOB or lightheadedness with hypotension: Yes Has patient had a PCN reaction causing severe rash involving mucus membranes or skin necrosis: Yes Has patient had a PCN reaction that required hospitalization: Yes Has patient had a PCN reaction occurring within the last 10 years: No If all of the above answers are "NO", then may proceed with Cephalosporin use.     Patient Measurements: Height: 5\' 2"  (157.5 cm) Weight: 125 lb (56.7 kg) IBW/kg (Calculated) : 50.1 Adjusted Body Weight:   Vital Signs: Temp: 98 F (36.7 C) (02/08 1009) Temp Source: Oral (02/08 1009) BP: 154/98 (02/08 1009) Pulse Rate: 105 (02/08 1009) Intake/Output from previous day: 02/07 0701 - 02/08 0700 In: 1660 [I.V.:1610; IV Piggyback:50] Out: -  Intake/Output from this shift: No intake/output data recorded.  Labs: Recent Labs    10/25/17 2100 10/26/17 0423 10/27/17 0537 10/28/17 1548  WBC 15.6* 9.6  --   --   HGB 12.6 10.7*  --   --   HCT 38.0 32.2*  --   --   PLT 258 211  --   --   CREATININE 1.32* 0.97 0.78 0.77  MG  --   --   --  1.6*  ALBUMIN 3.9  --   --   --   PROT 7.5  --   --   --   AST 38  --   --   --   ALT 26  --   --   --   ALKPHOS 51  --   --   --   BILITOT 1.3*  --   --   --    Estimated Creatinine Clearance: 34 mL/min (by C-G formula based on SCr of 0.77 mg/dL).   Microbiology: Recent Results (from the past 720 hour(s))  Urine Culture     Status: None   Collection Time: 10/25/17  7:53 PM  Result Value Ref Range Status   Specimen Description   Final    URINE, RANDOM Performed at [facility], [address] [place], [place] [address]    Special Requests   Final    NONE Performed at  [facility], [address] [place], [place] [address]    Culture   Final    NO GROWTH Performed at [facility], [address] [place], [place] [address]    Report Status 10/27/2017 FINAL  Final  MRSA PCR Screening     Status: None   Collection Time: 10/26/17  3:17 AM  Result Value Ref Range Status   MRSA by PCR NEGATIVE NEGATIVE Final    Comment:        The GeneXpert MRSA Assay (FDA approved for NASAL specimens only), is one component of a comprehensive MRSA colonization surveillance program. It is not intended to diagnose MRSA infection nor to guide or monitor treatment for MRSA infections. Performed at [facility], [address] [place], [place] [address]     Medical History: Past Medical History:  Diagnosis Date  . Altered mental status   . Cerebral infarction (HCC)   . Cognitive communication deficit   . Dementia   . Diabetes mellitus without complication (HCC)   . Hypertension   . Repeated falls   . Unspecified atrial  fibrillation (HCC)     Medications:  Infusions:  . sodium chloride 75 mL/hr at 10/28/17 0428  . cefTRIAXone (ROCEPHIN)  IV Stopped (10/27/17 1752)  . magnesium sulfate 1 - 4 g bolus IVPB      Assessment: 89 yof post stroke with significant confusion/agitation. Borderline prolonged QTc noted on ECG - pharmacy consulted to manage electrolytes  Goal of Therapy:  K > 4 Mg > 2  Plan:  K=3.6, Mg=1.6. Will replaced with magnesium 2g once and potassium oral packet once. Will recheck labs in AM.   Yolanda Bonine, PharmD Pharmacy Resident 10/28/2017,5:11 PM

## 2017-10-28 NOTE — Care Management Important Message (Signed)
Important Message  Patient Details  Name: Megan Arnold MRN: [id number] Date of Birth: 04/05/1921   Medicare Important Message Given:  Yes  Permission to sign per Gwenette GreetBrenda S [place] Lasseter RN MSN CCM     Gwenette GreetBrenda S Lillyann Ahart, RN 10/28/2017, 8:37 AM

## 2017-10-28 NOTE — Progress Notes (Signed)
MD vachanni was paged about pt trying to get out of bed and very confused. No further orders at this time.

## 2017-10-28 NOTE — Progress Notes (Signed)
MEDICATION RELATED CONSULT NOTE - INITIAL   Pharmacy Consult for electrolyte management Indication: borderline prolonged QTc with low dose Haldol  Allergies  Allergen Reactions  . Penicillins     Has patient had a PCN reaction causing immediate rash, facial/tongue/throat swelling, SOB or lightheadedness with hypotension: Yes Has patient had a PCN reaction causing severe rash involving mucus membranes or skin necrosis: Yes Has patient had a PCN reaction that required hospitalization: Yes Has patient had a PCN reaction occurring within the last 10 years: No If all of the above answers are "NO", then may proceed with Cephalosporin use.     Patient Measurements: Height: 5\' 2"  (157.5 cm) Weight: 125 lb (56.7 kg) IBW/kg (Calculated) : 50.1 Adjusted Body Weight:   Vital Signs: Temp: 98 F (36.7 C) (02/08 1009) Temp Source: Oral (02/08 1009) BP: 154/98 (02/08 1009) Pulse Rate: 105 (02/08 1009) Intake/Output from previous day: 02/07 0701 - 02/08 0700 In: 1660 [I.V.:1610; IV Piggyback:50] Out: -  Intake/Output from this shift: No intake/output data recorded.  Labs: Recent Labs    10/25/17 2100 10/26/17 0423 10/27/17 0537  WBC 15.6* 9.6  --   HGB 12.6 10.7*  --   HCT 38.0 32.2*  --   PLT 258 211  --   CREATININE 1.32* 0.97 0.78  ALBUMIN 3.9  --   --   PROT 7.5  --   --   AST 38  --   --   ALT 26  --   --   ALKPHOS 51  --   --   BILITOT 1.3*  --   --    Estimated Creatinine Clearance: 34 mL/min (by C-G formula based on SCr of 0.78 mg/dL).   Microbiology: Recent Results (from the past 720 hour(s))  Urine Culture     Status: None   Collection Time: 10/25/17  7:53 PM  Result Value Ref Range Status   Specimen Description   Final    URINE, RANDOM Performed at [facility], [address] [place], [place] [address]    Special Requests   Final    NONE Performed at [facility], [address] [place], [place] [address]    Culture   Final    NO  GROWTH Performed at [facility], [address] [place], [place] [address]    Report Status 10/27/2017 FINAL  Final  MRSA PCR Screening     Status: None   Collection Time: 10/26/17  3:17 AM  Result Value Ref Range Status   MRSA by PCR NEGATIVE NEGATIVE Final    Comment:        The GeneXpert MRSA Assay (FDA approved for NASAL specimens only), is one component of a comprehensive MRSA colonization surveillance program. It is not intended to diagnose MRSA infection nor to guide or monitor treatment for MRSA infections. Performed at [facility], [address] [place], [place] [address]     Medical History: Past Medical History:  Diagnosis Date  . Altered mental status   . Cerebral infarction (HCC)   . Cognitive communication deficit   . Dementia   . Diabetes mellitus without complication (HCC)   . Hypertension   . Repeated falls   . Unspecified atrial fibrillation (HCC)     Medications:  Infusions:  . sodium chloride 75 mL/hr at 10/28/17 0428  . cefTRIAXone (ROCEPHIN)  IV Stopped (10/27/17 1752)    Assessment: 89 yof post stroke with significant confusion/agitation. Borderline prolonged QTc noted on ECG -  pharmacy consulted to manage electrolytes  Goal of Therapy:  K > 4 Mg > 2  Plan:  Will check BMP and Mg this afternoon and dose per protocol.  Carola Frost, Pharm.D., BCPS Clinical Pharmacist 10/28/2017,1:55 PM

## 2017-10-28 NOTE — Plan of Care (Signed)
  Urinary Elimination: Signs and symptoms of infection will decrease 10/28/2017 1950 - Progressing by Donnel SaxonKennedy, Nil Xiong L, RN   Education: Knowledge of General Education information will improve 10/28/2017 1950 - Progressing by Donnel SaxonKennedy, Dimond Crotty L, RN   Health Behavior/Discharge Planning: Ability to manage health-related needs will improve 10/28/2017 1950 - Progressing by Donnel SaxonKennedy, Windi Toro L, RN   Clinical Measurements: Ability to maintain clinical measurements within normal limits will improve 10/28/2017 1950 - Progressing by Donnel SaxonKennedy, Keyonte Cookston L, RN   Elimination: Will not experience complications related to bowel motility 10/28/2017 1950 - Progressing by Donnel SaxonKennedy, Kealy Lewter L, RN   Skin Integrity: Risk for impaired skin integrity will decrease 10/28/2017 1950 - Progressing by Donnel SaxonKennedy, Italo Banton L, RN

## 2017-10-29 DIAGNOSIS — F0281 Dementia in other diseases classified elsewhere with behavioral disturbance: Secondary | ICD-10-CM

## 2017-10-29 DIAGNOSIS — G301 Alzheimer's disease with late onset: Secondary | ICD-10-CM

## 2017-10-29 DIAGNOSIS — G309 Alzheimer's disease, unspecified: Secondary | ICD-10-CM | POA: Diagnosis present

## 2017-10-29 LAB — BASIC METABOLIC PANEL
ANION GAP: 12 (ref 5–15)
BUN: 7 mg/dL (ref 6–20)
CO2: 22 mmol/L (ref 22–32)
Calcium: 8.1 mg/dL — ABNORMAL LOW (ref 8.9–10.3)
Chloride: 107 mmol/L (ref 101–111)
Creatinine, Ser: 0.73 mg/dL (ref 0.44–1.00)
Glucose, Bld: 96 mg/dL (ref 65–99)
Potassium: 3.2 mmol/L — ABNORMAL LOW (ref 3.5–5.1)
SODIUM: 141 mmol/L (ref 135–145)

## 2017-10-29 LAB — GLUCOSE, CAPILLARY
Glucose-Capillary: 81 mg/dL (ref 65–99)
Glucose-Capillary: 114 mg/dL — ABNORMAL HIGH (ref 65–99)
Glucose-Capillary: 141 mg/dL — ABNORMAL HIGH (ref 65–99)
Glucose-Capillary: 79 mg/dL (ref 65–99)

## 2017-10-29 LAB — POTASSIUM: POTASSIUM: 3.6 mmol/L (ref 3.5–5.1)

## 2017-10-29 LAB — MAGNESIUM: MAGNESIUM: 2.1 mg/dL (ref 1.7–2.4)

## 2017-10-29 LAB — VITAMIN D 25 HYDROXY (VIT D DEFICIENCY, FRACTURES): VIT D 25 HYDROXY: 23 ng/mL — AB (ref 30.0–100.0)

## 2017-10-29 MED ORDER — METOPROLOL TARTRATE 5 MG/5ML IV SOLN
2.5000 mg | Freq: Four times a day (QID) | INTRAVENOUS | Status: DC | PRN
  Administered 2017-10-31: 07:00:00 2.5 mg via INTRAVENOUS
  Filled 2017-10-29: qty 5

## 2017-10-29 MED ORDER — POTASSIUM CHLORIDE 20 MEQ PO PACK
40.0000 meq | PACK | Freq: Once | ORAL | Status: AC
  Administered 2017-10-29: 15:00:00 40 meq via ORAL
  Filled 2017-10-29: qty 2

## 2017-10-29 MED ORDER — RISPERIDONE 0.5 MG PO TABS
0.5000 mg | ORAL_TABLET | Freq: Two times a day (BID) | ORAL | Status: DC
  Filled 2017-10-29 (×3): qty 1

## 2017-10-29 MED ORDER — HALOPERIDOL LACTATE 5 MG/ML IJ SOLN
2.0000 mg | Freq: Four times a day (QID) | INTRAMUSCULAR | Status: DC | PRN
  Filled 2017-10-29: qty 1

## 2017-10-29 MED ORDER — POTASSIUM CHLORIDE CRYS ER 10 MEQ PO TBCR
EXTENDED_RELEASE_TABLET | ORAL | Status: AC
  Filled 2017-10-29: qty 1

## 2017-10-29 MED ORDER — MIRTAZAPINE 15 MG PO TBDP
15.0000 mg | ORAL_TABLET | Freq: Every day | ORAL | Status: DC
  Administered 2017-10-29: 15 mg via ORAL
  Filled 2017-10-29 (×3): qty 1

## 2017-10-29 MED ORDER — QUETIAPINE FUMARATE 25 MG PO TABS: 25.0000 mg | ORAL_TABLET | Freq: Every day | ORAL | Status: DC

## 2017-10-29 MED ORDER — TRAZODONE HCL 50 MG PO TABS
50.0000 mg | ORAL_TABLET | Freq: Three times a day (TID) | ORAL | Status: DC
  Administered 2017-10-29 – 2017-10-31 (×4): 50 mg via ORAL
  Filled 2017-10-29 (×4): qty 1

## 2017-10-29 MED ORDER — POTASSIUM CHLORIDE 20 MEQ PO PACK
40.0000 meq | PACK | Freq: Once | ORAL | Status: AC
Start: 2017-10-29 — End: 2017-10-29
  Administered 2017-10-29: 40 meq via ORAL
  Filled 2017-10-29: qty 2

## 2017-10-29 MED ORDER — METOPROLOL TARTRATE 25 MG PO TABS
25.0000 mg | ORAL_TABLET | Freq: Two times a day (BID) | ORAL | Status: DC
  Administered 2017-10-29 – 2017-10-31 (×4): 25 mg via ORAL
  Filled 2017-10-29 (×4): qty 1

## 2017-10-29 MED ORDER — POTASSIUM CHLORIDE CRYS ER 10 MEQ PO TBCR
20.0000 meq | EXTENDED_RELEASE_TABLET | ORAL | Status: AC
  Administered 2017-10-29: 20 meq via ORAL
  Filled 2017-10-29: qty 2

## 2017-10-29 NOTE — Progress Notes (Signed)
[employer] - [place] at [facility]   PATIENT NAME: Megan Arnold    MR#:  [id number]  DATE OF BIRTH:  10/05/1920  SUBJECTIVE:  CHIEF COMPLAINT:   Chief Complaint  Patient presents with  . Fall  . Emesis  . Altered Mental Status   -Patient came in with UTI. Has dementia and has been requiring Haldol and Ativan due to worsening confusion.  REVIEW OF SYSTEMS:  Review of Systems  Unable to perform ROS: Dementia    DRUG ALLERGIES:   Allergies  Allergen Reactions  . Penicillins     Has patient had a PCN reaction causing immediate rash, facial/tongue/throat swelling, SOB or lightheadedness with hypotension: Yes Has patient had a PCN reaction causing severe rash involving mucus membranes or skin necrosis: Yes Has patient had a PCN reaction that required hospitalization: Yes Has patient had a PCN reaction occurring within the last 10 years: No If all of the above answers are "NO", then may proceed with Cephalosporin use.     VITALS:  Blood pressure (!) 116/58, pulse 96, temperature 97.8 F (36.6 C), resp. rate 15, height 5\' 2"  (1.575 m), weight 56.7 kg (125 lb), SpO2 99 %.  PHYSICAL EXAMINATION:  Physical Exam  GENERAL:  84 y.o.-year-old patient lying in the bed, sedated at this time. Not in any acute distress.  EYES: Pupils equal, round, reactive to light and accommodation. No scleral icterus. Extraocular muscles intact.  HEENT: Head atraumatic, normocephalic. Oropharynx and nasopharynx clear.  NECK:  Supple, no jugular venous distention. No thyroid enlargement, no tenderness.  LUNGS: Normal breath sounds bilaterally, no wheezing, rales,rhonchi or crepitation. No use of accessory muscles of respiration. Decreased breath sounds at the bases CARDIOVASCULAR: S1, S2 normal. No  rubs, or gallops. 2/6 systolic murmur present ABDOMEN: Soft, nontender, nondistended. Bowel sounds present. No organomegaly or mass.  EXTREMITIES: No pedal edema, cyanosis, or  clubbing.  NEUROLOGIC: Patient is sedated from Ativan. Nodding head to some questions only. Able to withdraw all extremities to touch.  PSYCHIATRIC: The patient is sedated  SKIN: No obvious rash, lesion, or ulcer.    LABORATORY PANEL:   CBC Recent Labs  Lab 10/26/17 0423  WBC 9.6  HGB 10.7*  HCT 32.2*  PLT 211   ------------------------------------------------------------------------------------------------------------------  Chemistries  Recent Labs  Lab 10/25/17 2100  10/29/17 0519  NA 143   < > 141  K 4.4   < > 3.2*  CL 107   < > 107  CO2 22   < > 22  GLUCOSE 142*   < > 96  BUN 38*   < > 7  CREATININE 1.32*   < > 0.73  CALCIUM 9.2   < > 8.1*  MG  --    < > 2.1  AST 38  --   --   ALT 26  --   --   ALKPHOS 51  --   --   BILITOT 1.3*  --   --    < > = values in this interval not displayed.   ------------------------------------------------------------------------------------------------------------------  Cardiac Enzymes No results for input(s): TROPONINI in the last 168 hours. ------------------------------------------------------------------------------------------------------------------  RADIOLOGY:  Ct Head Wo Contrast  Result Date: 10/28/2017 CLINICAL DATA:  Status post fall, with confusion. Hit back of head. Initial encounter. EXAM: CT HEAD WITHOUT CONTRAST TECHNIQUE: Contiguous axial images were obtained from the base of the skull through the vertex without intravenous contrast. COMPARISON:  CT of the head performed 10/25/2017, and MRI of the brain  performed 09/19/2017 FINDINGS: Brain: No evidence of acute infarction, hemorrhage, hydrocephalus, extra-axial collection or mass lesion / mass effect. Prominence of the ventricles and sulci reflects mild to moderate cortical volume loss. Scattered periventricular white matter change likely reflects small vessel ischemic microangiopathy. A chronic infarct is noted at the left occipital lobe, with associated  encephalomalacia. The brainstem and fourth ventricle are within normal limits. The basal ganglia are unremarkable in appearance. No mass effect or midline shift is seen. Vascular: No hyperdense vessel or unexpected calcification. Skull: There is no evidence of fracture; visualized osseous structures are unremarkable in appearance. Sinuses/Orbits: The orbits are within normal limits. The paranasal sinuses and mastoid air cells are well-aerated. Other: No significant soft tissue abnormalities are seen. IMPRESSION: 1. No evidence of traumatic intracranial injury or fracture. 2. Mild to moderate cortical volume loss and scattered small vessel ischemic microangiopathy. 3. Chronic infarct at the left occipital lobe, with associated encephalomalacia. Electronically Signed   By: Roanna Raider M.D.   On: 10/28/2017 02:30    EKG:   Orders placed or performed during the hospital encounter of 10/25/17  . ED EKG  . ED EKG  . EKG 12-Lead  . EKG 12-Lead    ASSESSMENT AND PLAN:   84 year old female with known history of dementia, hypertension, atrial fibrillation, diabetes mellitus was brought in from assisted living facility secondary to fall and confusion  1. Fall and confusion-likely due to gait issues. - UTI- received Rocephin -Physical therapy consult recommended 24-hour assistance and possibly rehabilitation placement. No other source of infection identified. -CT of the head showing an old stroke. - Patient again had the fall in addition to trying to come out of the bed in hospital, repeat CT had is negative.  2. Acute renal failure-secondary to dehydration. Received IV fluids with improvement  3. Hypokalemia-replaced appropriately  4. Diabetes mellitus-was on Amaryl and tradjenta. -follow-up sugars, on sliding scale insulin   Hold Oral medication as she has decreased oral intake and blood sugar runs low normal side.  5. Recent stroke-no deficits noted. Continue statin and aspirin  6.  History of atrial fibrillation-rate controlled. On metoprolol and also on Xarelto for anticoagulation.  7. Altered mental status- baseline dementia with behavioral changes. -According to daughter, patient is close to baseline. But much agitated due to her new surroundings. -Has been getting round-the-clock Ativan and Haldol. -Psych consult to help with medication. Started on risperidone instead of Haldol and also added Seroquel  8. DVT prophylaxis-on Xarelto       All the records are reviewed and case discussed with Care Management/Social Workerr. Management plans discussed with the patient, family and they are in agreement.  CODE STATUS: DO NOT RESUSCITATE  TOTAL TIME TAKING CARE OF THIS PATIENT: 38 minutes.   POSSIBLE D/C Monday, DEPENDING ON CLINICAL CONDITION.   Jaben Benegas M.D on 10/29/2017 at 2:20 PM  Between 7am to 6pm - Pager - [phone]  After 6pm go to [email or web address] - Social research officer, government  [facility] [place] [facility]  Office  [phone]  CC: Primary care physician; Yetta Barre (Inactive)

## 2017-10-29 NOTE — Progress Notes (Signed)
Patient is Q4H vital signs post fall on 10/28/2017. Patient has refused vital signs to be obtained. At 1438, patient allowed staff to assess vital signs, within normal limits. HR sustaining 115, notified Dr. Nemiah CommanderKalisetti of HR. Received verbal order for metoprolol, patient refused to take the medication. Discussed with Dr. Nemiah CommanderKalisetti of the refusal, received verbal order for HR needs of IV medication if needed. Patient has a fib, HR at 105 at present.

## 2017-10-29 NOTE — Progress Notes (Signed)
MEDICATION RELATED CONSULT NOTE - follow up  Pharmacy Consult for electrolyte management Indication: borderline prolonged QTc with low dose Haldol  Allergies  Allergen Reactions  . Penicillins     Has patient had a PCN reaction causing immediate rash, facial/tongue/throat swelling, SOB or lightheadedness with hypotension: Yes Has patient had a PCN reaction causing severe rash involving mucus membranes or skin necrosis: Yes Has patient had a PCN reaction that required hospitalization: Yes Has patient had a PCN reaction occurring within the last 10 years: No If all of the above answers are "NO", then may proceed with Cephalosporin use.     Patient Measurements: Height: 5\' 2"  (157.5 cm) Weight: 125 lb (56.7 kg) IBW/kg (Calculated) : 50.1 Adjusted Body Weight:   Vital Signs: Temp: 97.8 F (36.6 C) (02/09 0353) Temp Source: Oral (02/08 1954) BP: 116/58 (02/09 0353) Pulse Rate: 96 (02/09 0353) Intake/Output from previous day: 02/08 0701 - 02/09 0700 In: 125 [I.V.:75; IV Piggyback:50] Out: -  Intake/Output from this shift: No intake/output data recorded.  Labs: Recent Labs    10/27/17 0537 10/28/17 1548 10/29/17 0519  CREATININE 0.78 0.77 0.73  MG  --  1.6* 2.1   Estimated Creatinine Clearance: 34 mL/min (by C-G formula based on SCr of 0.73 mg/dL).   Medical History: Past Medical History:  Diagnosis Date  . Altered mental status   . Cerebral infarction (HCC)   . Cognitive communication deficit   . Dementia   . Diabetes mellitus without complication (HCC)   . Hypertension   . Repeated falls   . Unspecified atrial fibrillation (HCC)     Medications:  Infusions:  . sodium chloride 75 mL/hr at 10/28/17 2312  . cefTRIAXone (ROCEPHIN)  IV Stopped (10/28/17 1741)    Assessment: 89 yof post stroke with significant confusion/agitation. Borderline prolonged QTc noted on ECG - pharmacy consulted to manage electrolytes  Goal of Therapy:  K > 4 Mg > 2  Plan:   K=3.6, Mg=1.6. Will replaced with magnesium 2g once and potassium oral packet 40mEq once. Will recheck labs in AM.   2/9: K 3.2,  Mag 2.1.  Patient refused the KCL packet last night. Will order KCL 20 meq po q2h x 2 (tablets). Will recheck K at 1800. Recheck electrolytes with am labs.  Bari MantisKristin Akaash Vandewater PharmD Clinical Pharmacist 10/29/2017

## 2017-10-29 NOTE — Consult Note (Signed)
[facility] Consult   Reason for Consult:  Agitation  Referring Physician:  Dr. Tressia Miners Patient Identification: Megan Arnold MRN:  [id number] Principal Diagnosis: Alzheimer's dementia with behavioral disturbance Diagnosis:   Patient Active Problem List   Diagnosis Date Noted  . Alzheimer's dementia with behavioral disturbance [G30.9, F02.81] 10/29/2017    Priority: High  . Gastroenteritis [K52.9] 10/26/2017  . CVA (cerebrovascular accident) ([place]) [I63.9] 09/17/2017  . Mixed Alzheimer's and vascular dementia [G30.9, F01.50, F02.80] 09/17/2017  . HTN (hypertension) [I10] 09/17/2017  . Diabetes ([place]) [E11.9] 09/17/2017  . CVA (cerebral vascular accident) ([place]) [I63.9] 09/17/2017  . DIABETES MELLITUS, TYPE II [E11.9] 07/11/2006  . HYPERLIPIDEMIA [E78.5] 07/11/2006  . HYPERTENSION [I10] 07/11/2006  . VARICOSE VEINS, LOWER EXTREMITIES [I83.90] 07/11/2006  . VAGINITIS, ATROPHIC [N95.2] 07/11/2006  . OSTEOPENIA [M89.9, M94.9] 07/11/2006  . HX, PERSONAL, PAST NONCOMPLIANCE [Z91.19] 07/11/2006    Total Time spent with patient: 1 hour   Identifying data. Mr. Lightle is a 84 year old female with a history of dementia.   Chief complaint. "Take your f..ing hand off me."  History of present illness. Information was obtained from the patient and the chart. The patient was admitted to medical floor for UTI. She has a history of dementia and falls. The patient has been loud and agitated requiring frequent injections of Haldol and Ativan. During the interview, her demeanor changes several times from playful to angry. She is somewhat grandiose trying to tell me about great experiances she had in life and how smart she was and her husband brilliant to cursing the nurse who was injecting medications. All along she demanded different food but could not tell me what she would like. Most of all, she wanted out of bed.   She did not appear depressed or psychotic, just  agitated.  Past psychiatric history. Apparently none except for dementia. Did not have the opportunity to talk to her family yet.   Family psychiatric history. Unknown.  Social history. She is a resident of [place].  Risk to Self: Is patient at risk for suicide?: No Risk to Others:   Prior Inpatient Therapy:   Prior Outpatient Therapy:    Past Medical History:  Past Medical History:  Diagnosis Date  . Altered mental status   . Cerebral infarction ([place])   . Cognitive communication deficit   . Dementia   . Diabetes mellitus without complication ([place])   . Hypertension   . Repeated falls   . Unspecified atrial fibrillation [facility])     Past Surgical History:  Procedure Laterality Date  . none     Family History: No family history on file.  Social History:  Social History   Substance and Sexual Activity  Alcohol Use No     Social History   Substance and Sexual Activity  Drug Use No    Social History   Socioeconomic History  . Marital status: Divorced    Spouse name: None  . Number of children: None  . Years of education: None  . Highest education level: None  Social Needs  . Financial resource strain: None  . Food insecurity - worry: None  . Food insecurity - inability: None  . Transportation needs - medical: None  . Transportation needs - non-medical: None  Occupational History  . Occupation: retired  Tobacco Use  . Smoking status: Never Smoker  . Smokeless tobacco: Never Used  Substance and Sexual Activity  . Alcohol use: No  . Drug use: No  . Sexual activity:  No  Other Topics Concern  . None  Social History Narrative  . None   Additional Social History:    Allergies:   Allergies  Allergen Reactions  . Penicillins     Has patient had a PCN reaction causing immediate rash, facial/tongue/throat swelling, SOB or lightheadedness with hypotension: Yes Has patient had a PCN reaction causing severe rash involving mucus membranes or skin  necrosis: Yes Has patient had a PCN reaction that required hospitalization: Yes Has patient had a PCN reaction occurring within the last 10 years: No If all of the above answers are "NO", then may proceed with Cephalosporin use.     Labs:  Results for orders placed or performed during the hospital encounter of 10/25/17 (from the past 48 hour(s))  Glucose, capillary     Status: None   Collection Time: 10/27/17  4:53 PM  Result Value Ref Range   Glucose-Capillary 83 65 - 99 mg/dL  Glucose, capillary     Status: None   Collection Time: 10/27/17  9:01 PM  Result Value Ref Range   Glucose-Capillary 85 65 - 99 mg/dL  Glucose, capillary     Status: None   Collection Time: 10/28/17  8:02 AM  Result Value Ref Range   Glucose-Capillary 87 65 - 99 mg/dL  VITAMIN D 25 Hydroxy (Vit-D Deficiency, Fractures)     Status: Abnormal   Collection Time: 10/28/17  3:48 PM  Result Value Ref Range   Vit D, 25-Hydroxy 23.0 (L) 30.0 - 100.0 ng/mL    Comment: (NOTE) Vitamin D deficiency has been defined by the Institute of Medicine and an Endocrine Society practice guideline as a level of serum 25-OH vitamin D less than 20 ng/mL (1,2). The Endocrine Society went on to further define vitamin D insufficiency as a level between 21 and 29 ng/mL (2). 1. IOM (Institute of Medicine). 2010. Dietary reference   intakes for calcium and D. [place]: The   [employer]. 2. Holick MF, Binkley [place], Bischoff-Ferrari HA, et al.   Evaluation, treatment, and prevention of vitamin D   deficiency: an Endocrine Society clinical practice   guideline. JCEM. 2011 Jul; 96(7):1911-30. Performed At: [facility] [place], [place] [id number] Rush Farmer MD EH:[id number] Performed at [facility], [address] [place], [place] [address]   Basic metabolic panel     Status: Abnormal   Collection Time: 10/28/17  3:48 PM  Result Value Ref Range   Sodium 141 135 - 145 mmol/L    Potassium 3.6 3.5 - 5.1 mmol/L   Chloride 108 101 - 111 mmol/L   CO2 21 (L) 22 - 32 mmol/L   Glucose, Bld 114 (H) 65 - 99 mg/dL   BUN 9 6 - 20 mg/dL   Creatinine, Ser 0.77 0.44 - 1.00 mg/dL   Calcium 8.4 (L) 8.9 - 10.3 mg/dL   GFR calc non Af Amer >60 >60 mL/min   GFR calc Af Amer >60 >60 mL/min    Comment: (NOTE) The eGFR has been calculated using the CKD EPI equation. This calculation has not been validated in all clinical situations. eGFR's persistently <60 mL/min signify possible Chronic Kidney Disease.    Anion gap 12 5 - 15    Comment: Performed at [facility], [address] [place], [place] [address]  Magnesium     Status: Abnormal   Collection Time: 10/28/17  3:48 PM  Result Value Ref Range   Magnesium 1.6 (L) 1.7 - 2.4 mg/dL  Comment: Performed at [facility], [address] [place], [place] [address]  Glucose, capillary     Status: Abnormal   Collection Time: 10/28/17  5:02 PM  Result Value Ref Range   Glucose-Capillary 100 (H) 65 - 99 mg/dL  Glucose, capillary     Status: Abnormal   Collection Time: 10/28/17  9:23 PM  Result Value Ref Range   Glucose-Capillary 101 (H) 65 - 99 mg/dL  Basic metabolic panel     Status: Abnormal   Collection Time: 10/29/17  5:19 AM  Result Value Ref Range   Sodium 141 135 - 145 mmol/L   Potassium 3.2 (L) 3.5 - 5.1 mmol/L   Chloride 107 101 - 111 mmol/L   CO2 22 22 - 32 mmol/L   Glucose, Bld 96 65 - 99 mg/dL   BUN 7 6 - 20 mg/dL   Creatinine, Ser 0.73 0.44 - 1.00 mg/dL   Calcium 8.1 (L) 8.9 - 10.3 mg/dL   GFR calc non Af Amer >60 >60 mL/min   GFR calc Af Amer >60 >60 mL/min    Comment: (NOTE) The eGFR has been calculated using the CKD EPI equation. This calculation has not been validated in all clinical situations. eGFR's persistently <60 mL/min signify possible Chronic Kidney Disease.    Anion gap 12 5 - 15    Comment: Performed at [facility], [address] [place], [place] [address]   Magnesium     Status: None   Collection Time: 10/29/17  5:19 AM  Result Value Ref Range   Magnesium 2.1 1.7 - 2.4 mg/dL    Comment: Performed at [facility], [address] [place], [place] [address]  Glucose, capillary     Status: None   Collection Time: 10/29/17  7:39 AM  Result Value Ref Range   Glucose-Capillary 81 65 - 99 mg/dL  Glucose, capillary     Status: Abnormal   Collection Time: 10/29/17 11:26 AM  Result Value Ref Range   Glucose-Capillary 114 (H) 65 - 99 mg/dL    Current Facility-Administered Medications  Medication Dose Route Frequency Provider Last Rate Last Dose  . 0.9 %  sodium chloride infusion   Intravenous Continuous Saundra Shelling, MD 75 mL/hr at 10/28/17 2312    . acetaminophen (TYLENOL) tablet 650 mg  650 mg Oral Q6H PRN Saundra Shelling, MD       Or  . acetaminophen (TYLENOL) suppository 650 mg  650 mg Rectal Q6H PRN Pyreddy, Reatha Harps, MD      . aspirin EC tablet 81 mg  81 mg Oral Daily Pyreddy, Pavan, MD   81 mg at 10/29/17 1144  . atorvastatin (LIPITOR) tablet 40 mg  40 mg Oral q1800 Saundra Shelling, MD   40 mg at 10/26/17 1723  . cefTRIAXone (ROCEPHIN) 1 g in dextrose 5 % 50 mL IVPB  1 g Intravenous Q24H Vaughan Basta, MD   Stopped at 10/28/17 1741  . docusate sodium (COLACE) capsule 100 mg  100 mg Oral Daily Vaughan Basta, MD   100 mg at 10/29/17 1145  . haloperidol lactate (HALDOL) injection 2 mg  2 mg Intravenous Q6H PRN Terrell Shimko B, MD      . insulin aspart (novoLOG) injection 0-5 Units  0-5 Units Subcutaneous QHS Gladstone Lighter, MD      . insulin aspart (novoLOG) injection 0-9 Units  0-9 Units Subcutaneous TID WC Gladstone Lighter, MD      . lisinopril (PRINIVIL,ZESTRIL) tablet 10 mg  10 mg Oral Daily  Saundra Shelling, MD   10 mg at 10/29/17 1145  . LORazepam (ATIVAN) injection 0.5-1 mg  0.5-1 mg Intravenous Q2H PRN Vaughan Basta, MD   1 mg at 10/29/17 1500  . memantine (NAMENDA XR) 24 hr capsule 28 mg  28  mg Oral Daily Pyreddy, Pavan, MD   28 mg at 10/29/17 1146  . metoprolol tartrate (LOPRESSOR) injection 2.5 mg  2.5 mg Intravenous Q6H PRN Gladstone Lighter, MD      . metoprolol tartrate (LOPRESSOR) tablet 25 mg  25 mg Oral BID Gladstone Lighter, MD   25 mg at 10/29/17 1441  . ondansetron (ZOFRAN) tablet 4 mg  4 mg Oral Q6H PRN Saundra Shelling, MD       Or  . ondansetron (ZOFRAN) injection 4 mg  4 mg Intravenous Q6H PRN Saundra Shelling, MD   4 mg at 10/27/17 0617  . potassium chloride (K-DUR,KLOR-CON) 10 MEQ CR tablet           . Rivaroxaban (XARELTO) tablet 15 mg  15 mg Oral Daily Pyreddy, Pavan, MD   15 mg at 10/29/17 1146  . senna-docusate (Senokot-S) tablet 1 tablet  1 tablet Oral QHS PRN Pyreddy, Reatha Harps, MD      . traZODone (DESYREL) tablet 50 mg  50 mg Oral TID Cristo Ausburn B, MD      . vitamin B-12 (CYANOCOBALAMIN) tablet 5,000 mcg  5,000 mcg Oral Daily Pyreddy, Reatha Harps, MD   5,000 mcg at 10/29/17 1147    Musculoskeletal: Strength & Muscle Tone: decreased Gait & Station: unsteady Patient leans: N/A  Psychiatric Specialty Exam: I reviewed physical examination performed by her internist and agree with the findings. Physical Exam  Nursing note and vitals reviewed. Psychiatric: Her affect is angry, labile and inappropriate. Her speech is rapid and/or pressured. She is agitated. Thought content is paranoid. Cognition and memory are impaired. She expresses impulsivity.    Review of Systems  Musculoskeletal: Positive for falls.  Neurological: Negative.   Psychiatric/Behavioral: The patient has insomnia.   All other systems reviewed and are negative.   Blood pressure (!) 147/96, pulse (!) 115, temperature 100.2 F (37.9 C), temperature source Oral, resp. rate 16, height 5' 2"  (1.575 m), weight 56.7 kg (125 lb), SpO2 100 %.Body mass index is 22.86 kg/m.  General Appearance: Disheveled  Eye Contact:  Good  Speech:  Pressured  Volume:  Increased  Mood:  Angry and Irritable   Affect:  Congruent  Thought Process:  Linear and Descriptions of Associations: Tangential  Orientation:  Full (Time, Place, and Person)  Thought Content:  WDL  Suicidal Thoughts:  No  Homicidal Thoughts:  No  Memory:  Immediate;   Poor Recent;   Poor Remote;   Poor  Judgement:  Poor  Insight:  Lacking  Psychomotor Activity:  Increased  Concentration:  Concentration: Poor and Attention Span: Poor  Recall:  [employer] of Knowledge:  Fair  Language:  Fair  Akathisia:  No  Handed:  Right  AIMS (if indicated):     Assets:  Communication Skills Desire for Improvement Financial Resources/Insurance Resilience Social Support  ADL's:  Intact  Cognition:  WNL  Sleep:        Treatment Plan Summary: Daily contact with patient to assess and evaluate symptoms and progress in treatment and Medication management  Disposition: No evidence of imminent risk to self or others at present.   Patient does not meet criteria for psychiatric inpatient admission. Supportive therapy provided about ongoing stressors.   PLAN: 1.  Start Trazodone 50 mg TID fro behavior. 2. Discontinue Seroquel and Risperdal. 3. Start Haldol 2 mg PRN 4. Continue Ativan sparringly (per nursing this is only medication that works). 5. Start Remeron 7.5 mg nightly 6. I will follow up  Orson Slick, MD 10/29/2017 4:01 PM

## 2017-10-29 NOTE — Progress Notes (Signed)
Patient is Q4H vital signs post fall on 10/28/2017. Patient has refused vital signs to be obtained.

## 2017-10-29 NOTE — Plan of Care (Signed)
  Urinary Elimination: Signs and symptoms of infection will decrease 10/29/2017 2039 - Progressing by Donnel SaxonKennedy, Caeleigh Prohaska L, RN   Education: Knowledge of General Education information will improve 10/29/2017 2039 - Progressing by Donnel SaxonKennedy, Khaniya Tenaglia L, RN   Health Behavior/Discharge Planning: Ability to manage health-related needs will improve 10/29/2017 2039 - Progressing by Donnel SaxonKennedy, Mischa Brittingham L, RN   Clinical Measurements: Ability to maintain clinical measurements within normal limits will improve 10/29/2017 2039 - Progressing by Donnel SaxonKennedy, Ruweyda Macknight L, RN   Elimination: Will not experience complications related to bowel motility 10/29/2017 2039 - Progressing by Donnel SaxonKennedy, Akari Defelice L, RN   Skin Integrity: Risk for impaired skin integrity will decrease 10/29/2017 2039 - Progressing by Donnel SaxonKennedy, Lucile Hillmann L, RN

## 2017-10-29 NOTE — Progress Notes (Signed)
MEDICATION RELATED CONSULT NOTE - follow up  Pharmacy Consult for electrolyte management Indication: borderline prolonged QTc with low dose Haldol  Allergies  Allergen Reactions  . Penicillins     Has patient had a PCN reaction causing immediate rash, facial/tongue/throat swelling, SOB or lightheadedness with hypotension: Yes Has patient had a PCN reaction causing severe rash involving mucus membranes or skin necrosis: Yes Has patient had a PCN reaction that required hospitalization: Yes Has patient had a PCN reaction occurring within the last 10 years: No If all of the above answers are "NO", then may proceed with Cephalosporin use.     Patient Measurements: Height: 5\' 2"  (157.5 cm) Weight: 125 lb (56.7 kg) IBW/kg (Calculated) : 50.1 Adjusted Body Weight:   Vital Signs: Temp: 100.2 F (37.9 C) (02/09 1438) Temp Source: Oral (02/09 1438) BP: 147/96 (02/09 1438) Pulse Rate: 115 (02/09 1438) Intake/Output from previous day: 02/08 0701 - 02/09 0700 In: 125 [I.V.:75; IV Piggyback:50] Out: -  Intake/Output from this shift: No intake/output data recorded.  Labs: Recent Labs    10/27/17 0537 10/28/17 1548 10/29/17 0519  CREATININE 0.78 0.77 0.73  MG  --  1.6* 2.1   Estimated Creatinine Clearance: 34 mL/min (by C-G formula based on SCr of 0.73 mg/dL).   Medical History: Past Medical History:  Diagnosis Date  . Altered mental status   . Cerebral infarction (HCC)   . Cognitive communication deficit   . Dementia   . Diabetes mellitus without complication (HCC)   . Hypertension   . Repeated falls   . Unspecified atrial fibrillation (HCC)     Medications:  Infusions:  . sodium chloride 75 mL/hr at 10/28/17 2312    Assessment: 89 yof post stroke with significant confusion/agitation. Borderline prolonged QTc noted on ECG - pharmacy consulted to manage electrolytes  Goal of Therapy:  K > 4 Mg > 2  Plan:  2/9: K 3.2,  Mag 2.1.  Patient refused the KCL packet  last night. Will order KCL 20 meq po q2h x 2 (tablets). Will recheck K at 1800. Recheck electrolytes with am labs.  2/9 PM: K: 3.6. Will order KCL 40mEq x 1 dose. Recheck electrolytes with AM labs and continue to replace as needed.   Gardner CandleSheema M Kerri Asche, PharmD, BCPS Clinical Pharmacist 10/29/2017 7:07 PM

## 2017-10-30 LAB — BASIC METABOLIC PANEL
ANION GAP: 11 (ref 5–15)
BUN: 8 mg/dL (ref 6–20)
CALCIUM: 8.2 mg/dL — AB (ref 8.9–10.3)
CO2: 19 mmol/L — AB (ref 22–32)
Chloride: 113 mmol/L — ABNORMAL HIGH (ref 101–111)
Creatinine, Ser: 0.64 mg/dL (ref 0.44–1.00)
GFR calc non Af Amer: >60 mL/min (ref >60)
GLUCOSE: 113 mg/dL — AB (ref 65–99)
Potassium: 3.5 mmol/L (ref 3.5–5.1)
Sodium: 143 mmol/L (ref 135–145)

## 2017-10-30 LAB — CBC
HEMATOCRIT: 34.5 % — AB (ref 35.0–47.0)
Hemoglobin: 11.6 g/dL — ABNORMAL LOW (ref 12.0–16.0)
MCH: 28.4 pg (ref 26.0–34.0)
MCHC: 33.5 g/dL (ref 32.0–36.0)
MCV: 84.8 fL (ref 80.0–100.0)
Platelets: 253 10*3/uL (ref 150–440)
RBC: 4.07 MIL/uL (ref 3.80–5.20)
RDW: 14.6 % — ABNORMAL HIGH (ref 11.5–14.5)
WBC: 15.8 10*3/uL — AB (ref 3.6–11.0)

## 2017-10-30 LAB — GLUCOSE, CAPILLARY
GLUCOSE-CAPILLARY: 86 mg/dL (ref 65–99)
GLUCOSE-CAPILLARY: 119 mg/dL — AB (ref 65–99)
Glucose-Capillary: 89 mg/dL (ref 65–99)
Glucose-Capillary: 164 mg/dL — ABNORMAL HIGH (ref 65–99)

## 2017-10-30 LAB — MAGNESIUM: Magnesium: 1.9 mg/dL (ref 1.7–2.4)

## 2017-10-30 MED ORDER — MAGNESIUM SULFATE IN D5W 1-5 GM/100ML-% IV SOLN
1.0000 g | Freq: Once | INTRAVENOUS | Status: AC
  Administered 2017-10-30: 1 g via INTRAVENOUS
  Filled 2017-10-30: qty 100

## 2017-10-30 MED ORDER — POTASSIUM CHLORIDE 20 MEQ PO PACK
40.0000 meq | PACK | Freq: Once | ORAL | Status: AC
  Administered 2017-10-30: 10:00:00 40 meq via ORAL
  Filled 2017-10-30: qty 2

## 2017-10-30 NOTE — Consult Note (Signed)
  [name] is asleep and not easy to arouse today. Spoke with her nurse. The patient is much calmer. She did not require any Ativan since last afternoon. She allowed medications and ate breakfast.  PLAN: 1. Please continue Remeron 15 mg nightly for sleep and appetite.  2. Continue Trazodone 50 mg TID for aggressive behavior. We could lower to 25 mg TID if too sedated. Has to be standing to work.   3. There is Haldol 2 mg PRN available for agitation.  4. Please use Ativan sparingly.

## 2017-10-30 NOTE — Plan of Care (Signed)
  Urinary Elimination: Signs and symptoms of infection will decrease 10/30/2017 1834 - Progressing by Donnel SaxonKennedy, Mary-Anne Polizzi L, RN   Education: Knowledge of General Education information will improve 10/30/2017 1834 - Progressing by Donnel SaxonKennedy, Ralyn Stlaurent L, RN   Health Behavior/Discharge Planning: Ability to manage health-related needs will improve 10/30/2017 1834 - Progressing by Donnel SaxonKennedy, Kye Silverstein L, RN   Clinical Measurements: Ability to maintain clinical measurements within normal limits will improve 10/30/2017 1834 - Progressing by Donnel SaxonKennedy, Martinez Boxx L, RN   Elimination: Will not experience complications related to bowel motility 10/30/2017 1834 - Progressing by Donnel SaxonKennedy, Deshayla Empson L, RN   Skin Integrity: Risk for impaired skin integrity will decrease 10/30/2017 1834 - Progressing by Donnel SaxonKennedy, Loman Logan L, RN   Skin Integrity: Risk for impaired skin integrity will decrease 10/30/2017 1834 - Progressing by Donnel SaxonKennedy, Shaelee Forni L, RN

## 2017-10-30 NOTE — Progress Notes (Signed)
[facility] - [place] at [facility]   PATIENT NAME: Megan GalesMaxine Arnold    MR#:  [id number]  DATE OF BIRTH:  06/29/1921  SUBJECTIVE:  CHIEF COMPLAINT:   Chief Complaint  Patient presents with  . Fall  . Emesis  . Altered Mental Status   -Appreciate psych consult in adjusting patient's medications. She is less agitated. Able to make small conversation but is too tired and sleepy  REVIEW OF SYSTEMS:  Review of Systems  Unable to perform ROS: Dementia    DRUG ALLERGIES:   Allergies  Allergen Reactions  . Penicillins     Has patient had a PCN reaction causing immediate rash, facial/tongue/throat swelling, SOB or lightheadedness with hypotension: Yes Has patient had a PCN reaction causing severe rash involving mucus membranes or skin necrosis: Yes Has patient had a PCN reaction that required hospitalization: Yes Has patient had a PCN reaction occurring within the last 10 years: No If all of the above answers are "NO", then may proceed with Cephalosporin use.     VITALS:  Blood pressure (!) 153/95, pulse (!) 124, temperature 97.6 F (36.4 C), temperature source Axillary, resp. rate 16, height 5\' 2"  (1.575 m), weight 56.7 kg (125 lb), SpO2 95 %.  PHYSICAL EXAMINATION:  Physical Exam  GENERAL:  84 y.o.-year-old patient lying in the bed,  Not in any acute distress.  EYES: Pupils equal, round, reactive to light and accommodation. No scleral icterus. Extraocular muscles intact.  HEENT: Head atraumatic, normocephalic. Oropharynx and nasopharynx clear.  NECK:  Supple, no jugular venous distention. No thyroid enlargement, no tenderness.  LUNGS: Normal breath sounds bilaterally, no wheezing, rales,rhonchi or crepitation. No use of accessory muscles of respiration. Decreased breath sounds at the bases CARDIOVASCULAR: S1, S2 normal. No  rubs, or gallops. 2/6 systolic murmur present ABDOMEN: Soft, nontender, nondistended. Bowel sounds present. No organomegaly or mass.    EXTREMITIES: No pedal edema, cyanosis, or clubbing.  NEUROLOGIC: Cranial nerves are intact. Little sleepy during our conversation. Able to move all extremities. Sensation seems to be intact  PSYCHIATRIC: The patient is sleepy SKIN: No obvious rash, lesion, or ulcer.    LABORATORY PANEL:   CBC Recent Labs  Lab 10/30/17 0441  WBC 15.8*  HGB 11.6*  HCT 34.5*  PLT 253   ------------------------------------------------------------------------------------------------------------------  Chemistries  Recent Labs  Lab 10/25/17 2100  10/30/17 0441  NA 143   < > 143  K 4.4   < > 3.5  CL 107   < > 113*  CO2 22   < > 19*  GLUCOSE 142*   < > 113*  BUN 38*   < > 8  CREATININE 1.32*   < > 0.64  CALCIUM 9.2   < > 8.2*  MG  --    < > 1.9  AST 38  --   --   ALT 26  --   --   ALKPHOS 51  --   --   BILITOT 1.3*  --   --    < > = values in this interval not displayed.   ------------------------------------------------------------------------------------------------------------------  Cardiac Enzymes No results for input(s): TROPONINI in the last 168 hours. ------------------------------------------------------------------------------------------------------------------  RADIOLOGY:  No results found.  EKG:   Orders placed or performed during the hospital encounter of 10/25/17  . ED EKG  . ED EKG  . EKG 12-Lead  . EKG 12-Lead    ASSESSMENT AND PLAN:   84 year old female with known history of dementia, hypertension, atrial fibrillation, diabetes  mellitus was brought in from assisted living facility secondary to fall and confusion  1. Fall and confusion-likely due to gait issues. - UTI- received Rocephin-stop after 5 days -Physical therapy consult recommended 24-hour assistance and possibly rehabilitation placement. No other source of infection identified. -CT of the head showing an old stroke. - Patient again had another fall when trying to come out of the bed in hospital,  repeat CT had is negative.  2. Acute renal failure-secondary to dehydration. Received IV fluids with improvement  3. Hypokalemia-replaced appropriately  4. Diabetes mellitus-was on Amaryl and tradjenta. -follow-up sugars, on sliding scale insulin   Hold Oral medication as she has decreased oral intake and blood sugar runs low normal side.  5. Recent stroke-no deficits noted. Continue statin and aspirin  6. History of atrial fibrillation-rate controlled. On metoprolol and also on Xarelto for anticoagulation.  7. Altered mental status- baseline dementia with behavioral changes. -According to daughter, patient is close to baseline. But much agitated due to her new surroundings. -Was getting round-the-clock Ativan and Haldol. -Appreciate psych consult. Started on Remeron at bedtime. Also on trazodone 3 times a day during the day for agitation. If remains sleepy today, decrease the dose of trazodone to 25 3 times a day. -Also on Haldol when necessary. Will minimize Ativan usage  8. DVT prophylaxis-on Xarelto   If agitation is controlled, can be discharged to rehabilitation tomorrow     All the records are reviewed and case discussed with Care Management/Social Workerr. Management plans discussed with the patient, family and they are in agreement.  CODE STATUS: DO NOT RESUSCITATE  TOTAL TIME TAKING CARE OF THIS PATIENT: 38 minutes.   POSSIBLE D/C Monday, DEPENDING ON CLINICAL CONDITION.   Selby Foisy M.D on 10/30/2017 at 12:04 PM  Between 7am to 6pm - Pager - [phone]  After 6pm go to [email or web address] - Social research officer, government  [facility] [place] [facility]  Office  [phone]  CC: Primary care physician; Yetta Barre (Inactive)

## 2017-10-30 NOTE — Progress Notes (Signed)
MEDICATION RELATED CONSULT NOTE - follow up  Pharmacy Consult for electrolyte management Indication: borderline prolonged QTc with low dose Haldol  Allergies  Allergen Reactions  . Penicillins     Has patient had a PCN reaction causing immediate rash, facial/tongue/throat swelling, SOB or lightheadedness with hypotension: Yes Has patient had a PCN reaction causing severe rash involving mucus membranes or skin necrosis: Yes Has patient had a PCN reaction that required hospitalization: Yes Has patient had a PCN reaction occurring within the last 10 years: No If all of the above answers are "NO", then may proceed with Cephalosporin use.     Patient Measurements: Height: 5\' 2"  (157.5 cm) Weight: 125 lb (56.7 kg) IBW/kg (Calculated) : 50.1 Adjusted Body Weight:   Vital Signs: Temp: 98.8 F (37.1 C) (02/10 0513) Temp Source: Oral (02/10 0513) BP: 144/96 (02/10 0513) Pulse Rate: 103 (02/10 0513) Intake/Output from previous day: No intake/output data recorded. Intake/Output from this shift: No intake/output data recorded.  Labs: Recent Labs    10/28/17 1548 10/29/17 0519 10/30/17 0441  WBC  --   --  15.8*  HGB  --   --  11.6*  HCT  --   --  34.5*  PLT  --   --  253  CREATININE 0.77 0.73 0.64  MG 1.6* 2.1 1.9   Estimated Creatinine Clearance: 34 mL/min (by C-G formula based on SCr of 0.64 mg/dL).   Medical History: Past Medical History:  Diagnosis Date  . Altered mental status   . Cerebral infarction (HCC)   . Cognitive communication deficit   . Dementia   . Diabetes mellitus without complication (HCC)   . Hypertension   . Repeated falls   . Unspecified atrial fibrillation (HCC)     Medications:  Infusions:  . sodium chloride 75 mL/hr at 10/30/17 0226    Assessment: 89 yof post stroke with significant confusion/agitation. Borderline prolonged QTc noted on ECG - pharmacy consulted to manage electrolytes  Goal of Therapy:  K > 4 Mg > 2  Plan:  2/9: K  3.2,  Mag 2.1.  Patient refused the KCL packet last night. Will order KCL 20 meq po q2h x 2 (tablets). Will recheck K at 1800. Recheck electrolytes with am labs.  2/9 PM: K: 3.6. Will order KCL 40mEq x 1 dose. Recheck electrolytes with AM labs and continue to replace as needed.   2/10  K 3.5. Mag 1.9. Will order KCL 40 meq PO x 1 and Magnesium 1 gram IV x 1. F/u electrolytes in am  Angelique BlonderMerrill,Frayda Egley A, PharmD, BCPS Clinical Pharmacist 10/30/2017 8:55 AM

## 2017-10-31 DIAGNOSIS — Z66 Do not resuscitate: Secondary | ICD-10-CM

## 2017-10-31 DIAGNOSIS — Z9181 History of falling: Secondary | ICD-10-CM

## 2017-10-31 DIAGNOSIS — Z515 Encounter for palliative care: Secondary | ICD-10-CM

## 2017-10-31 LAB — BASIC METABOLIC PANEL
Anion gap: 10 (ref 5–15)
BUN: 8 mg/dL (ref 6–20)
CHLORIDE: 112 mmol/L — AB (ref 101–111)
CO2: 20 mmol/L — ABNORMAL LOW (ref 22–32)
Calcium: 8.2 mg/dL — ABNORMAL LOW (ref 8.9–10.3)
Creatinine, Ser: 0.67 mg/dL (ref 0.44–1.00)
GFR calc non Af Amer: >60 mL/min (ref >60)
Glucose, Bld: 131 mg/dL — ABNORMAL HIGH (ref 65–99)
POTASSIUM: 4 mmol/L (ref 3.5–5.1)
SODIUM: 142 mmol/L (ref 135–145)

## 2017-10-31 LAB — GLUCOSE, CAPILLARY
GLUCOSE-CAPILLARY: 106 mg/dL — AB (ref 65–99)
GLUCOSE-CAPILLARY: 147 mg/dL — AB (ref 65–99)

## 2017-10-31 MED ORDER — HALOPERIDOL 1 MG PO TABS: 2.0000 mg | ORAL_TABLET | Freq: Four times a day (QID) | ORAL | 0 refills | Status: AC | PRN

## 2017-10-31 MED ORDER — TRAZODONE HCL 50 MG PO TABS: 50.0000 mg | ORAL_TABLET | Freq: Three times a day (TID) | ORAL | 0 refills | Status: AC

## 2017-10-31 MED ORDER — MIRTAZAPINE 15 MG PO TBDP: 15.0000 mg | ORAL_TABLET | Freq: Every day | ORAL | 2 refills | Status: AC

## 2017-10-31 NOTE — Progress Notes (Signed)
New referral for out patient Palliative to follow at [facility]. Plan is for discharge today. Patient information faxed to referral. Dayna BarkerKaren Robertson RN, BSN, [facility], [facility] Liaison [phone]

## 2017-10-31 NOTE — Discharge Summary (Addendum)
[facility] - [place] at [facility]   PATIENT NAME: Megan Arnold    MR#:  [id number]  DATE OF BIRTH:  19-Oct-1920  DATE OF ADMISSION:  10/25/2017   ADMITTING PHYSICIAN: Ihor Austin, MD  DATE OF DISCHARGE:  10/31/17  PRIMARY CARE PHYSICIAN: Yetta Barre (Inactive)   ADMISSION DIAGNOSIS:   Gastroenteritis [K52.9] Encephalopathy [G93.40] Acute cystitis with hematuria [N30.01] AKI (acute kidney injury) (HCC) [N17.9]  DISCHARGE DIAGNOSIS:   Principal Problem:   Alzheimer's dementia with behavioral disturbance Active Problems:   Gastroenteritis   SECONDARY DIAGNOSIS:   Past Medical History:  Diagnosis Date  . Altered mental status   . Cerebral infarction (HCC)   . Cognitive communication deficit   . Dementia   . Diabetes mellitus without complication (HCC)   . Hypertension   . Repeated falls   . Unspecified atrial fibrillation [facility])     HOSPITAL COURSE:   84 year old female with known history of dementia, hypertension, atrial fibrillation, diabetes mellitus was brought in from assisted living facility secondary to fall and confusion  1. Fall and confusion-likely due to gait issues. - UTI- received Rocephin -Physical therapy consult recommended 24-hour assistance and possibly rehabilitation placement. No other source of infection identified. -CT of the head showing an old stroke. -Patient had 2 more falls in the hospital, repeat CT head negative  2. Acute renal failure-secondary to dehydration. Received IV fluids with improvement  3. Hypokalemia-replaced appropriately  4. Diabetes mellitus-discontinue her Amaryl and tradjenta as she has decreased oral intake and blood sugar runs low normal side.  5. Recent stroke-no deficits noted. On statin and aspirin  6. History of atrial fibrillation-rate controlled. On metoprolol - on xarelto - but  high risk for falls - recommend discontinuation after discussion of goals of care with  palliative care.  7. Altered mental status- baseline dementia with behavioral changes. -According to daughter, patient is at baseline.  -Appreciate psych consult to help with meds.  -on Remeron at bedtime. Also on trazodone 3 times a day during the day for agitation. If remains sleepy - consider to decrease the dose of trazodone to 25mg , 3 times a day. -Also on Haldol when necessary  Discharge back to [place] [facility] today    DISCHARGE CONDITIONS:   Guarded  CONSULTS OBTAINED:   Treatment Team:  Shari Prows, MD Clapacs, Jackquline Denmark, MD  DRUG ALLERGIES:   Allergies  Allergen Reactions  . Penicillins     Has patient had a PCN reaction causing immediate rash, facial/tongue/throat swelling, SOB or lightheadedness with hypotension: Yes Has patient had a PCN reaction causing severe rash involving mucus membranes or skin necrosis: Yes Has patient had a PCN reaction that required hospitalization: Yes Has patient had a PCN reaction occurring within the last 10 years: No If all of the above answers are "NO", then may proceed with Cephalosporin use.    DISCHARGE MEDICATIONS:   Allergies as of 10/31/2017      Reactions   Penicillins    Has patient had a PCN reaction causing immediate rash, facial/tongue/throat swelling, SOB or lightheadedness with hypotension: Yes Has patient had a PCN reaction causing severe rash involving mucus membranes or skin necrosis: Yes Has patient had a PCN reaction that required hospitalization: Yes Has patient had a PCN reaction occurring within the last 10 years: No If all of the above answers are "NO", then may proceed with Cephalosporin use.      Medication List    STOP taking these medications  clopidogrel 75 MG tablet Commonly known as:  PLAVIX   glimepiride 2 MG tablet Commonly known as:  AMARYL   JANUVIA 25 MG tablet Generic drug:  sitaGLIPtin   QUEtiapine 25 MG tablet Commonly known as:  SEROQUEL     TAKE these  medications   acetaminophen 325 MG tablet Commonly known as:  TYLENOL Take 2 tablets (650 mg total) by mouth every 6 (six) hours as needed for mild pain (or Fever >/= 101).   aspirin EC 81 MG tablet Take 81 mg by mouth daily.   atorvastatin 40 MG tablet Commonly known as:  LIPITOR Take 1 tablet (40 mg total) by mouth daily at 6 PM.   bisacodyl 5 MG EC tablet Commonly known as:  DULCOLAX Take 10 mg by mouth daily as needed for moderate constipation.   docusate sodium 100 MG capsule Commonly known as:  COLACE Take 1 capsule (100 mg total) by mouth 2 (two) times daily.   haloperidol 1 MG tablet Commonly known as:  HALDOL Take 2 tablets (2 mg total) by mouth every 6 (six) hours as needed for agitation.   LIPOFLAVONOID Tabs Take by mouth as directed.   lisinopril 10 MG tablet Commonly known as:  PRINIVIL,ZESTRIL Take 1 tablet (10 mg total) by mouth daily.   metoprolol succinate 25 MG 24 hr tablet Commonly known as:  TOPROL-XL Take 1 tablet (25 mg total) by mouth at bedtime.   mirtazapine 15 MG disintegrating tablet Commonly known as:  REMERON SOL-TAB Take 1 tablet (15 mg total) by mouth at bedtime.   NAMENDA XR 28 MG Cp24 24 hr capsule Generic drug:  memantine Take 28 mg by mouth daily.   promethazine 25 MG/ML injection Commonly known as:  PHENERGAN Inject 25 mg into the vein every 6 (six) hours as needed for nausea or vomiting. Up to 2 days   Rivaroxaban 15 MG Tabs tablet Commonly known as:  XARELTO Take 15 mg by mouth daily.   traZODone 50 MG tablet Commonly known as:  DESYREL Take 1 tablet (50 mg total) by mouth 3 (three) times daily.   Vitamin B-12 5000 MCG Tbdp Take 5,000 Units by mouth daily.        DISCHARGE INSTRUCTIONS:   1. Palliative care follow up  DIET:   Cardiac diet  ACTIVITY:   Activity as tolerated  OXYGEN:   Home Oxygen: No.  Oxygen Delivery: room air  DISCHARGE LOCATION:   nursing home   If you experience worsening of  your admission symptoms, develop shortness of breath, life threatening emergency, suicidal or homicidal thoughts you must seek medical attention immediately by calling 911 or calling your MD immediately  if symptoms less severe.  You Must read complete instructions/literature along with all the possible adverse reactions/side effects for all the Medicines you take and that have been prescribed to you. Take any new Medicines after you have completely understood and accpet all the possible adverse reactions/side effects.   Please note  You were cared for by a hospitalist during your hospital stay. If you have any questions about your discharge medications or the care you received while you were in the hospital after you are discharged, you can call the unit and asked to speak with the hospitalist on call if the hospitalist that took care of you is not available. Once you are discharged, your primary care physician will handle any further medical issues. Please note that NO REFILLS for any discharge medications will be authorized once you are discharged,  as it is imperative that you return to your primary care physician (or establish a relationship with a primary care physician if you do not have one) for your aftercare needs so that they can reassess your need for medications and monitor your lab values.    On the day of Discharge:  VITAL SIGNS:   Blood pressure (!) 163/93, pulse 80, temperature 98.2 F (36.8 C), resp. rate 16, height 5\' 2"  (1.575 m), weight 56.7 kg (125 lb), SpO2 96 %.  PHYSICAL EXAMINATION:   GENERAL:  84 y.o.-year-old patient lying in the bed,  Not in any acute distress.  EYES: Pupils equal, round, reactive to light and accommodation. No scleral icterus. Extraocular muscles intact.  HEENT: Head atraumatic, normocephalic. Oropharynx and nasopharynx clear.  NECK:  Supple, no jugular venous distention. No thyroid enlargement, no tenderness.  LUNGS: Normal breath sounds  bilaterally, no wheezing, rales,rhonchi or crepitation. No use of accessory muscles of respiration. Decreased breath sounds at the bases CARDIOVASCULAR: S1, S2 normal. No  rubs, or gallops. 2/6 systolic murmur present ABDOMEN: Soft, nontender, nondistended. Bowel sounds present. No organomegaly or mass.  EXTREMITIES: No pedal edema, cyanosis, or clubbing.  NEUROLOGIC: Cranial nerves are intact.  Able to move all extremities. does not want to follow commands. Sensation seems to be intact  PSYCHIATRIC: The patient is alert, oriented to self. SKIN: No obvious rash, lesion, or ulcer.    DATA REVIEW:   CBC Recent Labs  Lab 10/30/17 0441  WBC 15.8*  HGB 11.6*  HCT 34.5*  PLT 253    Chemistries  Recent Labs  Lab 10/25/17 2100  10/30/17 0441 10/31/17 0410  NA 143   < > 143 142  K 4.4   < > 3.5 4.0  CL 107   < > 113* 112*  CO2 22   < > 19* 20*  GLUCOSE 142*   < > 113* 131*  BUN 38*   < > 8 8  CREATININE 1.32*   < > 0.64 0.67  CALCIUM 9.2   < > 8.2* 8.2*  MG  --    < > 1.9  --   AST 38  --   --   --   ALT 26  --   --   --   ALKPHOS 51  --   --   --   BILITOT 1.3*  --   --   --    < > = values in this interval not displayed.     Microbiology Results  Results for orders placed or performed during the hospital encounter of 10/25/17  Urine Culture     Status: None   Collection Time: 10/25/17  7:53 PM  Result Value Ref Range Status   Specimen Description   Final    URINE, RANDOM Performed at [facility], [address], [place], [place] [address]    Special Requests   Final    NONE Performed at [facility], [address], [place], [place] [address]    Culture   Final    NO GROWTH Performed at [facility], [address] [place], [place] [address]    Report Status 10/27/2017 FINAL  Final  MRSA PCR Screening     Status: None   Collection Time: 10/26/17  3:17 AM  Result Value Ref Range Status   MRSA by PCR NEGATIVE NEGATIVE Final     Comment:        The GeneXpert MRSA Assay (FDA approved for  NASAL specimens only), is one component of a comprehensive MRSA colonization surveillance program. It is not intended to diagnose MRSA infection nor to guide or monitor treatment for MRSA infections. Performed at [facility], [address] [place], [place] [address]     RADIOLOGY:  No results found.   Management plans discussed with the patient, family and they are in agreement.  CODE STATUS:     Code Status Orders  (From admission, onward)        Start     Ordered   10/27/17 1426  Do not attempt resuscitation (DNR)  Continuous    Question Answer Comment  In the event of cardiac or respiratory ARREST Do not call a "code blue"   In the event of cardiac or respiratory ARREST Do not perform Intubation, CPR, defibrillation or ACLS   In the event of cardiac or respiratory ARREST Use medication by any route, position, wound care, and other measures to relive pain and suffering. May use oxygen, suction and manual treatment of airway obstruction as needed for comfort.      10/27/17 1425    Code Status History    Date Active Date Inactive Code Status Order ID Comments User Context   10/26/2017 03:22 10/27/2017 14:25 Full Code [id number]  Ihor Austin, MD Inpatient   09/17/2017 16:14 09/20/2017 17:49 Full Code [id number]  Marguarite Arbour, MD ED      TOTAL TIME TAKING CARE OF THIS PATIENT: 38 minutes.    Enid Baas M.D on 10/31/2017 at 10:44 AM  Between 7am to 6pm - Pager - [phone]  After 6pm go to [email or web address] - Scientist, research (life sciences) [place] [facility]  Office  [phone]  CC: Primary care physician; Yetta Barre (Inactive)   Note: This dictation was prepared with Dragon dictation along with smaller phrase technology. Any transcriptional errors that result from this process are unintentional.

## 2017-10-31 NOTE — Progress Notes (Signed)
Pt discharged [facility]. Report called to accepting RN. IV removed, pt on room air and in no distress. Otilio JeffersonMadelyn S Fenton, RN

## 2017-10-31 NOTE — Clinical Social Work Note (Addendum)
Patient to be d/c'ed today to [facility].  Patient and family agreeable to plans will transport via ems RN to call report [phone] room [phone].  Patient's grandson Jomarie LongsJoseph, was at bedside and is aware of discharge today.  Windell MouldingEric Maricsa Sammons, MSW, Theresia MajorsLCSWA [phone]

## 2017-10-31 NOTE — Consult Note (Signed)
Consultation Note Date: 10/31/2017   Patient Name: Megan Arnold  DOB: 1921-08-30  MRN: [id number]  Age / Sex: 84 y.o., female  PCP: Yetta Barre (Inactive) Referring Physician: Enid Baas, MD  Reason for Consultation: Establishing goals of care and Psychosocial/spiritual support  HPI/Patient Profile: 84 y.o. female   admitted on 10/25/2017 with known history of Alzheimer's dementia diabetes mellitus, hypertension, atrial fibrillation is a resident of [facility].  Per her grandson today at the bedside she is only been there for less than a month.   Patient was referred for fall and confusion.  Patient had a witnessed fall   No history of any head injury.  She has a skin tear in her right arm.  Patient also had nausea vomiting and diarrhea for the last few days according to the facility notes.   She has been screaming yelling and spitting on the floor and trying to get out of bed in the emergency room.   Was worked up with CT head which showed old left occipital infarct and no acute abnormalities.  CT cervical spine showed degenerative disc disease.  Psychiatry has been consulted and made recommendations for the following Start Trazodone 50 mg TID fro behavior. 2. Discontinue Seroquel and Risperdal. 3. Start Haldol 2 mg PRN 4. Continue Ativan sparringly (per nursing this is only medication that works). 5. Start Remeron 7.5 mg nightly  Per Attending the patient is stable for discharge and it is anticipated for her to be discharged back to [facility] health care today.  Palliative has been consulted and were fortunate enough to meet with grandson briefly to have a face-to-face conversation about overall goals of care.  Family clearly face advance directive decisions, treatment option decisions and anticipatory care needs.   Clinical Assessment and Goals of Care:   This NP Lorinda Creed  reviewed medical records, received report from team, assessed the patient and then meet at the patient's bedside along with her grandson/Joseph Hatley to discuss diagnosis, prognosis, GOC, EOL wishes disposition and options.  Concept of Hospice and Palliative Care were discussed  A detailed discussion was had today regarding advanced directives.  Concepts specific to code status, artifical feeding and hydration, continued IV antibiotics and rehospitalization was had.  The difference between a aggressive medical intervention path  and a palliative comfort care path for this patient at this time was had.  Values and goals of care important to patient and family were attempted to be elicited.  MOST form introduced  Her grandson spoke clearly about the family's concern for this patient being at [facility] health care.  They are unhappy with the care she is receiving there.  He does verbalize that the patient has had behaviors that are reflected in the history such as spitting and cussing for a much longer time than this immediate past month medical situation.  Clear precise psychiatric history is unknown however the grandson does support that clearly there were psychiatric issues in the past.  He also speaks to the patient's extreme intelligence and  her achievement for multiple high level education degrees.  Grandson verbalizes his frustration in the limited options available for care of this patient at this time in her life specifically related to her behavioral issues.  The focus of care is comfort and dignity.  Recommendation is for palliative care services to follow with the facility to continue today's conversation and transition patient to hospice if eligible   Natural trajectory and expectations at EOL were discussed.  Questions and concerns addressed.   Family encouraged to call with questions or concerns.    PMT will continue to support holistically.  There is no documented healthcare power  of attorney however the grandson Katheran James is self-reported as the person that family turns to for decisions regarding the care of Ms. Awe  SUMMARY OF RECOMMENDATIONS    Code Status/Advance Care Planning:  DNR   Symptom Management:   Agitation/       as recommended from psychiatry continue trazodone 50 mg 3 times daily, Haldol 2 mg p.o. as needed and continue Remeron 7.5 mg qhs  Palliative Prophylaxis:   Aspiration, Bowel Regimen and Oral Care  Additional Recommendations (Limitations, Scope, Preferences):  Full Scope Treatment  Psycho-social/Spiritual:   Desire for further Chaplaincy support:no  Additional Recommendations: Education on Hospice  Prognosis:   Unable to determine  Discharge Planning:    Skilled Nursing Facility for rehab with Palliative care service follow-up      Primary Diagnoses: Present on Admission: . Gastroenteritis . Alzheimer's dementia with behavioral disturbance   I have reviewed the medical record, interviewed the patient and family, and examined the patient. The following aspects are pertinent.  Past Medical History:  Diagnosis Date  . Altered mental status   . Cerebral infarction (HCC)   . Cognitive communication deficit   . Dementia   . Diabetes mellitus without complication (HCC)   . Hypertension   . Repeated falls   . Unspecified atrial fibrillation [facility])    Social History   Socioeconomic History  . Marital status: Divorced    Spouse name: None  . Number of children: None  . Years of education: None  . Highest education level: None  Social Needs  . Financial resource strain: None  . Food insecurity - worry: None  . Food insecurity - inability: None  . Transportation needs - medical: None  . Transportation needs - non-medical: None  Occupational History  . Occupation: retired  Tobacco Use  . Smoking status: Never Smoker  . Smokeless tobacco: Never Used  Substance and Sexual Activity  . Alcohol use: No    . Drug use: No  . Sexual activity: No  Other Topics Concern  . None  Social History Narrative  . None   No family history on file. Scheduled Meds: . aspirin EC  81 mg Oral Daily  . atorvastatin  40 mg Oral q1800  . docusate sodium  100 mg Oral Daily  . insulin aspart  0-5 Units Subcutaneous QHS  . insulin aspart  0-9 Units Subcutaneous TID WC  . lisinopril  10 mg Oral Daily  . memantine  28 mg Oral Daily  . metoprolol tartrate  25 mg Oral BID  . mirtazapine  15 mg Oral QHS  . Rivaroxaban  15 mg Oral Daily  . traZODone  50 mg Oral TID  . vitamin B-12  5,000 mcg Oral Daily   Continuous Infusions: . sodium chloride 75 mL/hr at 10/31/17 0610   PRN Meds:.acetaminophen **OR** acetaminophen, haloperidol lactate, LORazepam, metoprolol tartrate,  ondansetron **OR** ondansetron (ZOFRAN) IV, senna-docusate Medications Prior to Admission:  Prior to Admission medications   Medication Sig Start Date End Date Taking? Authorizing Provider  acetaminophen (TYLENOL) 325 MG tablet Take 2 tablets (650 mg total) by mouth every 6 (six) hours as needed for mild pain (or Fever >/= 101). 09/20/17  Yes Wieting, Richard, MD  aspirin EC 81 MG tablet Take 81 mg by mouth daily.   Yes [provider]  atorvastatin (LIPITOR) 40 MG tablet Take 1 tablet (40 mg total) by mouth daily at 6 PM. 09/20/17  Yes Wieting, Richard, MD  bisacodyl (DULCOLAX) 5 MG EC tablet Take 10 mg by mouth daily as needed for moderate constipation.   Yes [provider]  Cyanocobalamin (VITAMIN B-12) 5000 MCG TBDP Take 5,000 Units by mouth daily.   Yes [provider]  docusate sodium (COLACE) 100 MG capsule Take 1 capsule (100 mg total) by mouth 2 (two) times daily. 09/20/17  Yes Wieting, Richard, MD  glimepiride (AMARYL) 2 MG tablet Take 1 tablet (2 mg total) by mouth daily with breakfast. 09/21/17  Yes Wieting, Richard, MD  JANUVIA 25 MG tablet Take 25 mg by mouth daily.  01/29/17  Yes [provider]   lisinopril (PRINIVIL,ZESTRIL) 10 MG tablet Take 1 tablet (10 mg total) by mouth daily. 09/20/17  Yes Wieting, Richard, MD  metoprolol succinate (TOPROL-XL) 25 MG 24 hr tablet Take 1 tablet (25 mg total) by mouth at bedtime. 09/20/17  Yes Wieting, Richard, MD  NAMENDA XR 28 MG CP24 24 hr capsule Take 28 mg by mouth daily. 02/09/17  Yes [provider]  promethazine (PHENERGAN) 25 MG/ML injection Inject 25 mg into the vein every 6 (six) hours as needed for nausea or vomiting. Up to 2 days   Yes [provider]  Rivaroxaban (XARELTO) 15 MG TABS tablet Take 15 mg by mouth daily.   Yes [provider]  Vitamins-Lipotropics (LIPOFLAVONOID) TABS Take by mouth as directed.   Yes [provider]  clopidogrel (PLAVIX) 75 MG tablet Take 1 tablet (75 mg total) by mouth daily. Patient not taking: Reported on 10/25/2017 09/20/17   Alford HighlandWieting, Richard, MD  haloperidol (HALDOL) 1 MG tablet Take 2 tablets (2 mg total) by mouth every 6 (six) hours as needed for agitation. 10/31/17 11/30/17  Enid BaasKalisetti, Radhika, MD  mirtazapine (REMERON SOL-TAB) 15 MG disintegrating tablet Take 1 tablet (15 mg total) by mouth at bedtime. 10/31/17   Enid BaasKalisetti, Radhika, MD  QUEtiapine (SEROQUEL) 25 MG tablet Take 1 tablet (25 mg total) by mouth at bedtime. Patient not taking: Reported on 10/25/2017 09/20/17   Alford HighlandWieting, Richard, MD  traZODone (DESYREL) 50 MG tablet Take 1 tablet (50 mg total) by mouth 3 (three) times daily. 10/31/17   Enid BaasKalisetti, Radhika, MD   Allergies  Allergen Reactions  . Penicillins     Has patient had a PCN reaction causing immediate rash, facial/tongue/throat swelling, SOB or lightheadedness with hypotension: Yes Has patient had a PCN reaction causing severe rash involving mucus membranes or skin necrosis: Yes Has patient had a PCN reaction that required hospitalization: Yes Has patient had a PCN reaction occurring within the last 10 years: No If all of the above answers are "NO", then may  proceed with Cephalosporin use.    Review of Systems  Unable to perform ROS: Dementia    Physical Exam  Constitutional: She appears lethargic. She appears cachectic. She appears ill.  Cardiovascular: Tachycardia present.  Pulmonary/Chest: She has decreased breath sounds.  Musculoskeletal:  Generalized weakness and atrophy  Neurological: She appears lethargic.  Skin: Skin is warm and dry.    Vital Signs: BP (!) 154/91   Pulse (!) 116   Temp 98.2 F (36.8 C)   Resp 16   Ht 5\' 2"  (1.575 m)   Wt 56.7 kg (125 lb)   SpO2 96%   BMI 22.86 kg/m  Pain Assessment: No/denies pain       SpO2: SpO2: 96 % O2 Device:SpO2: 96 % O2 Flow Rate: .   IO: Intake/output summary: No intake or output data in the 24 hours ending 10/31/17 1332  LBM: Last BM Date: 10/30/17 Baseline Weight: Weight: 56.7 kg (125 lb) Most recent weight: Weight: 56.7 kg (125 lb)     Palliative Assessment/Data: 30% at best   Discussed with Dr Nemiah Commander  Time In: 1245 Time Out: 1400 Time Total: 75 min Greater than 50%  of this time was spent counseling and coordinating care related to the above assessment and plan.  Signed by: Lorinda Creed, NP   Please contact Palliative Medicine Team phone at [phone] for questions and concerns.  For individual provider: See Loretha Stapler

## 2017-10-31 NOTE — Progress Notes (Signed)
Pt had a fall from bed tonight. At approximately 2300 the bed alarm went off. The nurse sitting right across from 109 went immediately to the room. The pt was already on the floor by the bed. The pt was aggressive and combative when trying to assist back to bed. [facility]. Safety Zone and Safety Huddle done.

## 2017-10-31 NOTE — Progress Notes (Signed)
Pharmacy Electrolyte Monitoring Consult:  Pharmacy consulted to assist in monitoring and replacing electrolytes in this 84 y.o. female admitted on 10/25/2017 with Fall; Emesis; and Altered Mental Status   Plan: Electrolyte values are within normal limits this morning. No replacement is indicated at this time. Will follow up electrolytes with AM labs.  Pharmacy will continue to monitor per consult.  Labs:  Sodium (mmol/L)  Date Value  10/31/2017 142   Potassium (mmol/L)  Date Value  10/31/2017 4.0   Magnesium (mg/dL)  Date Value  29/56/213002/06/2018 1.9   Calcium (mg/dL)  Date Value  86/57/846902/07/2018 8.2 (L)   Albumin (g/dL)  Date Value  62/95/284102/01/2018 3.9   [place] Furqan Gosselin  Pharmacy Student 10/31/2017 8:40 AM

## 2017-11-02 DIAGNOSIS — Z66 Do not resuscitate: Secondary | ICD-10-CM

## 2017-11-02 DIAGNOSIS — Z9181 History of falling: Secondary | ICD-10-CM

## 2017-11-02 DIAGNOSIS — Z515 Encounter for palliative care: Secondary | ICD-10-CM

## 2017-11-18 DEATH — deceased

## 2018-12-19 IMAGING — CT CT HEAD W/O CM
3 series · 15 of 44 positions shown, 18 images · non-contrast
Comparison: CT of the head performed 10/25/2017, and MRI of the
brain performed 09/19/2017

CLINICAL DATA: Status post fall, with confusion. Hit back of head.
Initial encounter.

EXAM:
CT HEAD WITHOUT CONTRAST
TECHNIQUE: Contiguous axial images were obtained from the base of the skull
through the vertex without intravenous contrast.

[Series 3: head wo · axial · 0.40mm/px · z∈[-93,+17]mm · 9 of 27 slices shown, 12 images]
[im 3/27  brain]
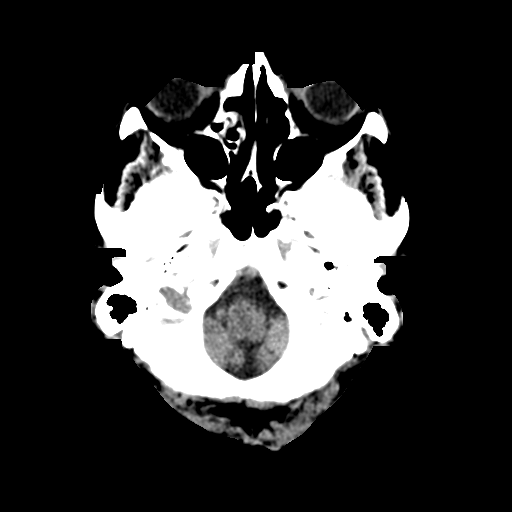
[im 3/27  bone]
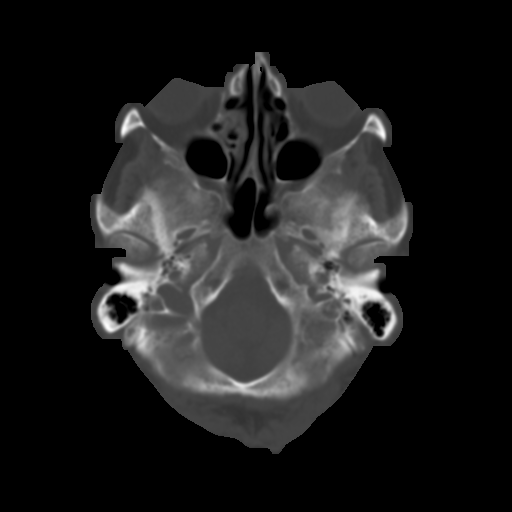
[im 6/27  brain]
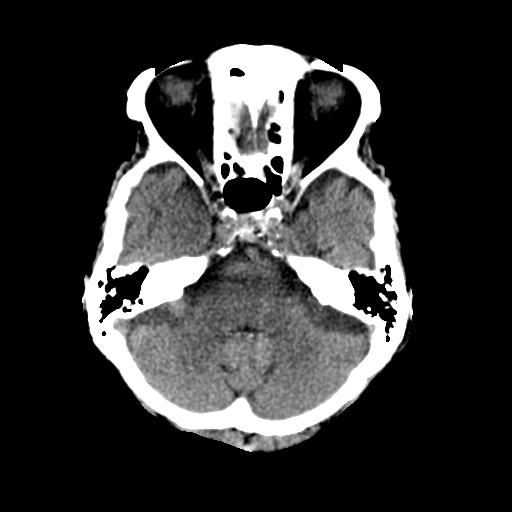
[im 8/27  brain]
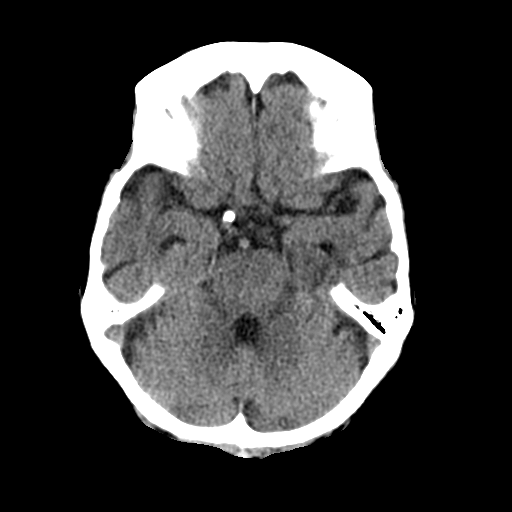
[im 11/27  brain]
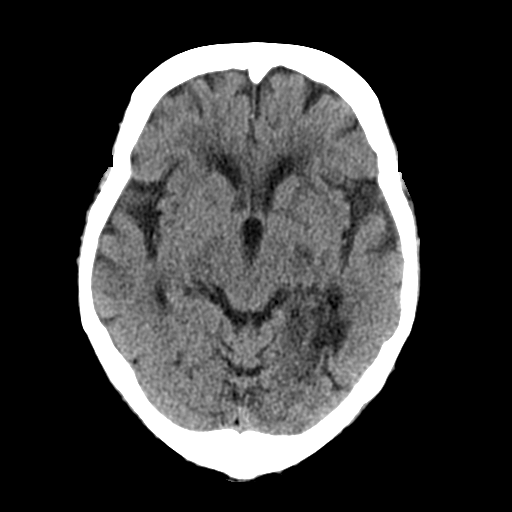
[im 14/27  brain]
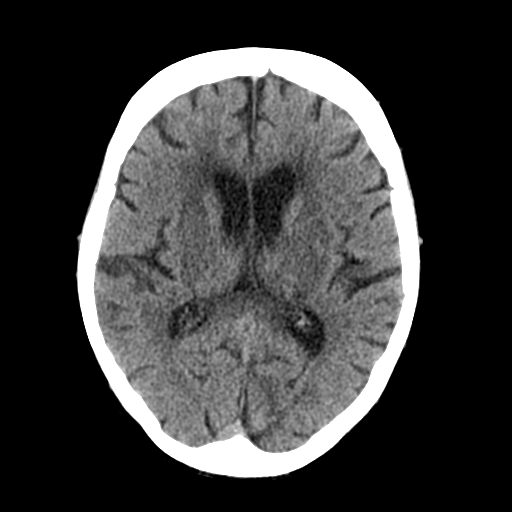
[im 14/27  bone]
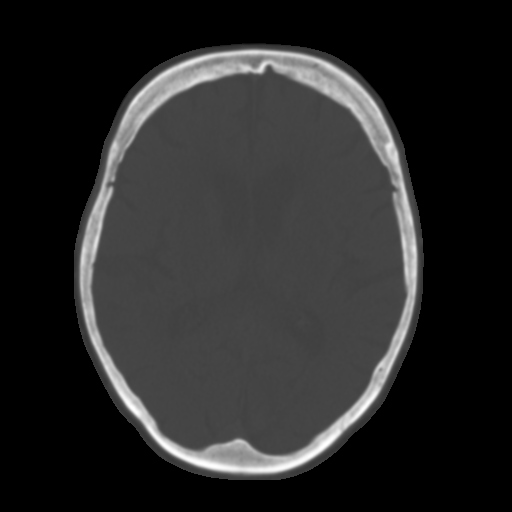
[im 17/27  brain]
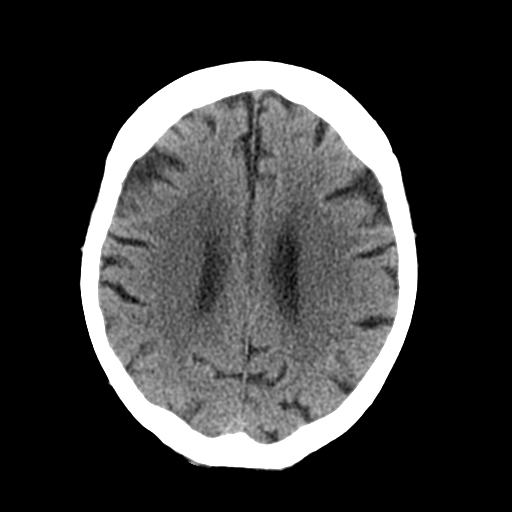
[im 20/27  brain]
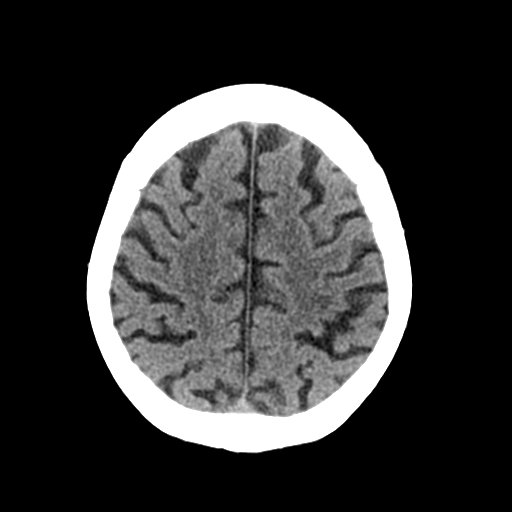
[im 22/27  brain]
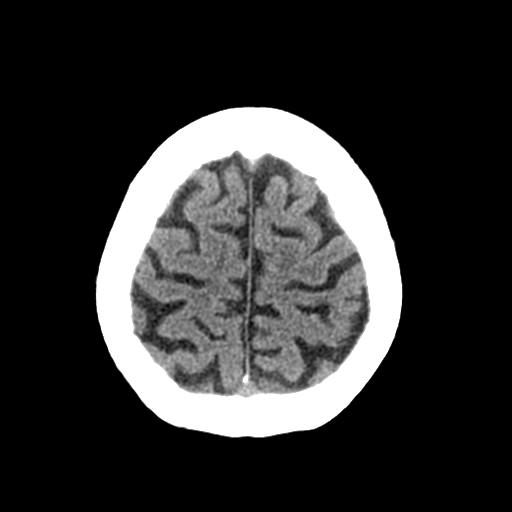
[im 25/27  brain]
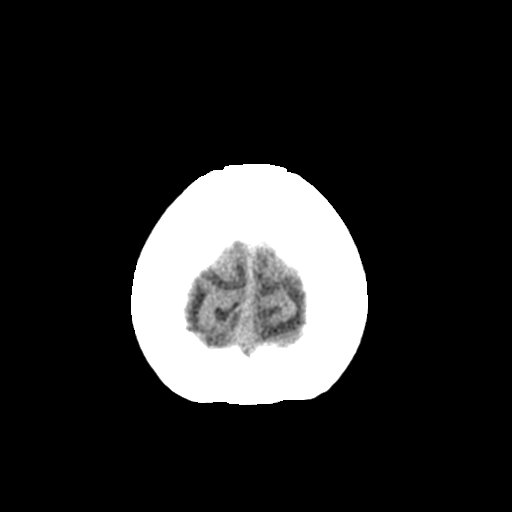
[im 25/27  bone]
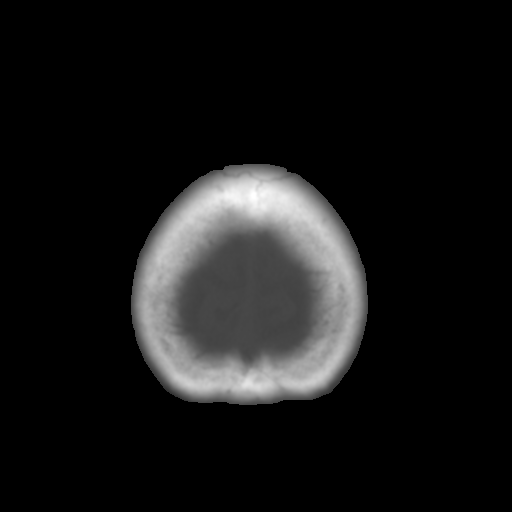

[Series 4: coronal soft tissue · coronal · 0.28mm/px · 3 of 56 slices shown]
[im 19/56  brain]
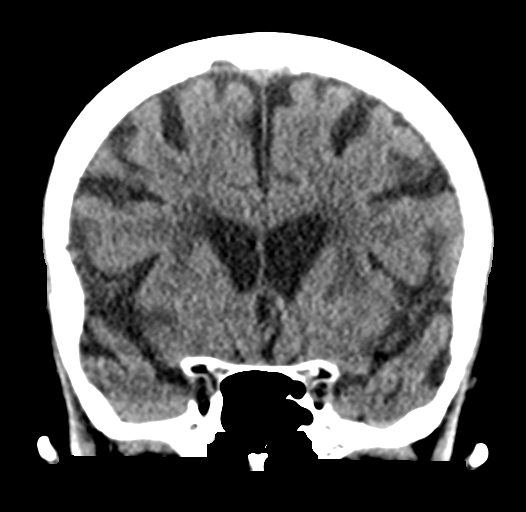
[im 25/56  brain]
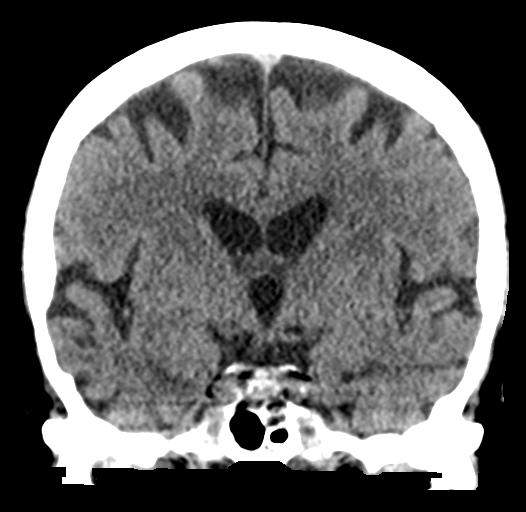
[im 31/56  brain]
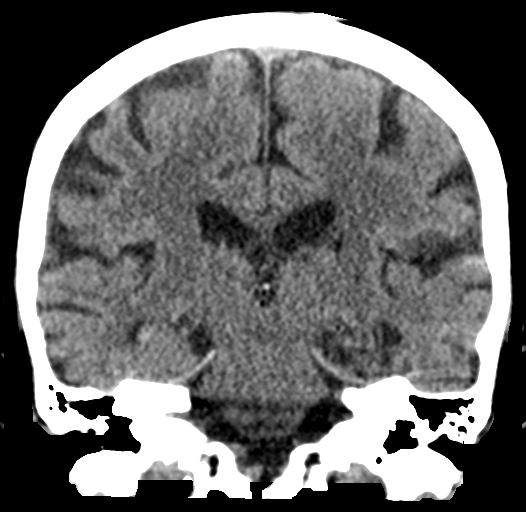

[Series 5: sagittal soft tissue · sagittal · 0.28mm/px · 3 of 48 slices shown]
[im 16/48  brain]
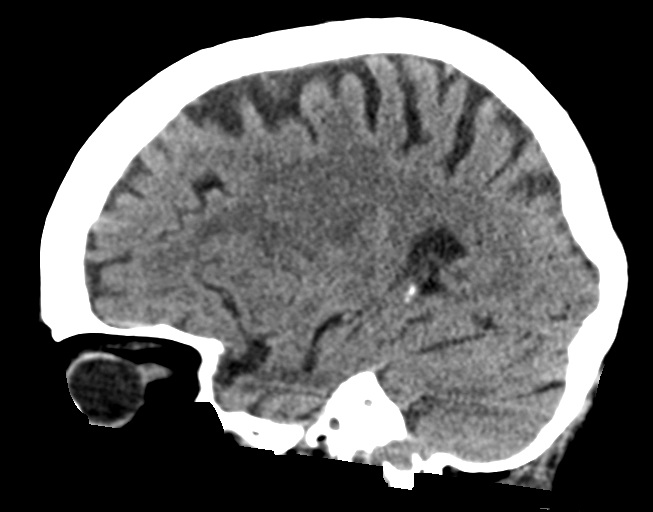
[im 24/48  brain]
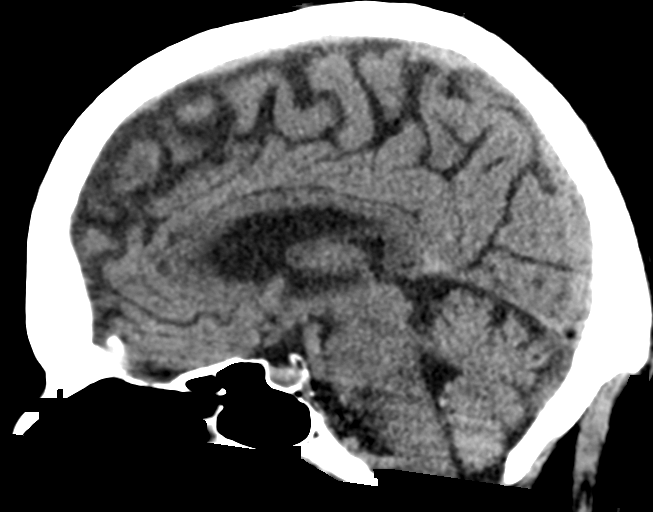
[im 32/48  brain]
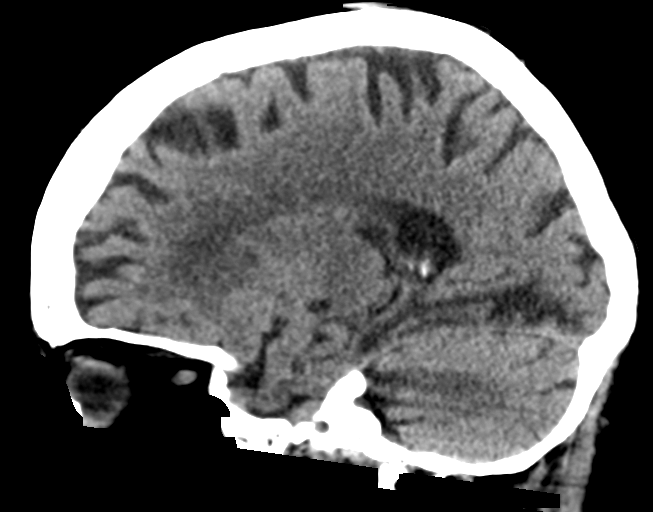

[15 of 44 positions shown; findings below may reference images not displayed]

FINDINGS: Brain: No evidence of acute infarction, hemorrhage, hydrocephalus,
extra-axial collection or mass lesion / mass effect.

Prominence of the ventricles and sulci reflects mild to moderate
cortical volume loss. Scattered periventricular white matter change
likely reflects small vessel ischemic microangiopathy. A chronic
infarct is noted at the left occipital lobe, with associated
encephalomalacia.

The brainstem and fourth ventricle are within normal limits. The
basal ganglia are unremarkable in appearance. No mass effect or
midline shift is seen.

Vascular: No hyperdense vessel or unexpected calcification.

Skull: There is no evidence of fracture; visualized osseous
structures are unremarkable in appearance.

Sinuses/Orbits: The orbits are within normal limits. The paranasal
sinuses and mastoid air cells are well-aerated.

Other: No significant soft tissue abnormalities are seen.
IMPRESSION: 1. No evidence of traumatic intracranial injury or fracture.
2. Mild to moderate cortical volume loss and scattered small vessel
ischemic microangiopathy.
3. Chronic infarct at the left occipital lobe, with associated
encephalomalacia.
